# Patient Record
Sex: Female | Born: 1935 | Race: White | Hispanic: No | Marital: Single | Smoking: Current every day smoker
Health system: Southern US, Community
[De-identification: ages and names within clinical notes are randomized; demographics above are authoritative.]

## PROBLEM LIST (undated history)

## (undated) DIAGNOSIS — I499 Cardiac arrhythmia, unspecified: Secondary | ICD-10-CM

## (undated) DIAGNOSIS — M109 Gout, unspecified: Secondary | ICD-10-CM

## (undated) DIAGNOSIS — M069 Rheumatoid arthritis, unspecified: Secondary | ICD-10-CM

## (undated) DIAGNOSIS — Z993 Dependence on wheelchair: Secondary | ICD-10-CM

## (undated) DIAGNOSIS — N183 Chronic kidney disease, stage 3 unspecified: Secondary | ICD-10-CM

## (undated) DIAGNOSIS — I482 Chronic atrial fibrillation, unspecified: Secondary | ICD-10-CM

## (undated) DIAGNOSIS — E119 Type 2 diabetes mellitus without complications: Secondary | ICD-10-CM

## (undated) DIAGNOSIS — I10 Essential (primary) hypertension: Secondary | ICD-10-CM

## (undated) DIAGNOSIS — H409 Unspecified glaucoma: Secondary | ICD-10-CM

## (undated) DIAGNOSIS — M1990 Unspecified osteoarthritis, unspecified site: Secondary | ICD-10-CM

## (undated) HISTORY — PX: OTHER SURGICAL HISTORY: SHX169

## (undated) HISTORY — PX: ABDOMINAL HYSTERECTOMY: SHX81

## (undated) HISTORY — PX: CATARACT EXTRACTION: SUR2

## (undated) HISTORY — PX: APPENDECTOMY: SHX54

---

## 2013-06-04 ENCOUNTER — Emergency Department (HOSPITAL_BASED_OUTPATIENT_CLINIC_OR_DEPARTMENT_OTHER): Payer: Medicare Other

## 2013-06-04 ENCOUNTER — Emergency Department (HOSPITAL_BASED_OUTPATIENT_CLINIC_OR_DEPARTMENT_OTHER)
Admission: EM | Admit: 2013-06-04 | Discharge: 2013-06-04 | Disposition: A | Discharge status: Home / Self Care | Payer: Medicare Other | Attending: Emergency Medicine | Admitting: Emergency Medicine

## 2013-06-04 ENCOUNTER — Encounter (HOSPITAL_BASED_OUTPATIENT_CLINIC_OR_DEPARTMENT_OTHER): Payer: Self-pay | Admitting: Emergency Medicine

## 2013-06-04 DIAGNOSIS — R630 Anorexia: Secondary | ICD-10-CM | POA: Insufficient documentation

## 2013-06-04 DIAGNOSIS — Z794 Long term (current) use of insulin: Secondary | ICD-10-CM | POA: Insufficient documentation

## 2013-06-04 DIAGNOSIS — Z8739 Personal history of other diseases of the musculoskeletal system and connective tissue: Secondary | ICD-10-CM | POA: Insufficient documentation

## 2013-06-04 DIAGNOSIS — I10 Essential (primary) hypertension: Secondary | ICD-10-CM | POA: Insufficient documentation

## 2013-06-04 DIAGNOSIS — R531 Weakness: Secondary | ICD-10-CM

## 2013-06-04 DIAGNOSIS — E119 Type 2 diabetes mellitus without complications: Secondary | ICD-10-CM | POA: Insufficient documentation

## 2013-06-04 DIAGNOSIS — Z79899 Other long term (current) drug therapy: Secondary | ICD-10-CM | POA: Insufficient documentation

## 2013-06-04 DIAGNOSIS — R5381 Other malaise: Secondary | ICD-10-CM | POA: Insufficient documentation

## 2013-06-04 DIAGNOSIS — Z9849 Cataract extraction status, unspecified eye: Secondary | ICD-10-CM | POA: Insufficient documentation

## 2013-06-04 DIAGNOSIS — Z8669 Personal history of other diseases of the nervous system and sense organs: Secondary | ICD-10-CM | POA: Insufficient documentation

## 2013-06-04 DIAGNOSIS — M109 Gout, unspecified: Secondary | ICD-10-CM | POA: Insufficient documentation

## 2013-06-04 HISTORY — DX: Type 2 diabetes mellitus without complications: E11.9

## 2013-06-04 HISTORY — DX: Unspecified osteoarthritis, unspecified site: M19.90

## 2013-06-04 HISTORY — DX: Cardiac arrhythmia, unspecified: I49.9

## 2013-06-04 HISTORY — DX: Rheumatoid arthritis, unspecified: M06.9

## 2013-06-04 HISTORY — DX: Gout, unspecified: M10.9

## 2013-06-04 HISTORY — DX: Essential (primary) hypertension: I10

## 2013-06-04 HISTORY — DX: Unspecified glaucoma: H40.9

## 2013-06-04 LAB — COMPREHENSIVE METABOLIC PANEL
ALT: 15 U/L (ref 0–35)
BUN: 55 mg/dL — ABNORMAL HIGH (ref 6–23)
CO2: 30 mEq/L (ref 19–32)
Calcium: 13 mg/dL — ABNORMAL HIGH (ref 8.4–10.5)
Chloride: 91 mEq/L — ABNORMAL LOW (ref 96–112)
Creatinine, Ser: 1.6 mg/dL — ABNORMAL HIGH (ref 0.50–1.10)
GFR calc Af Amer: 33 mL/min — ABNORMAL LOW (ref >90)
GFR calc non Af Amer: 29 mL/min — ABNORMAL LOW (ref >90)
Glucose, Bld: 146 mg/dL — ABNORMAL HIGH (ref 70–99)
Potassium: 3.2 mEq/L — ABNORMAL LOW (ref 3.5–5.1)
Sodium: 134 mEq/L — ABNORMAL LOW (ref 135–145)

## 2013-06-04 LAB — URINALYSIS, ROUTINE W REFLEX MICROSCOPIC
Bilirubin Urine: NEGATIVE
Hgb urine dipstick: NEGATIVE
Nitrite: NEGATIVE
Protein, ur: NEGATIVE mg/dL
Specific Gravity, Urine: 1.011 (ref 1.005–1.030)
Urobilinogen, UA: 0.2 mg/dL (ref 0.0–1.0)
pH: 5.5 (ref 5.0–8.0)

## 2013-06-04 LAB — URINE MICROSCOPIC-ADD ON

## 2013-06-04 LAB — CBC WITH DIFFERENTIAL/PLATELET
Basophils Absolute: 0.1 10*3/uL (ref 0.0–0.1)
Basophils Relative: 1 % (ref 0–1)
Eosinophils Absolute: 0.1 10*3/uL (ref 0.0–0.7)
HCT: 38.2 % (ref 36.0–46.0)
Hemoglobin: 12.7 g/dL (ref 12.0–15.0)
MCH: 30.8 pg (ref 26.0–34.0)
MCHC: 33.2 g/dL (ref 30.0–36.0)
MCV: 92.7 fL (ref 78.0–100.0)
Monocytes Absolute: 1.2 10*3/uL — ABNORMAL HIGH (ref 0.1–1.0)
Neutro Abs: 9 10*3/uL — ABNORMAL HIGH (ref 1.7–7.7)
Neutrophils Relative %: 76 % (ref 43–77)
Platelets: 300 10*3/uL (ref 150–400)
RBC: 4.12 MIL/uL (ref 3.87–5.11)
RDW: 14.1 % (ref 11.5–15.5)

## 2013-06-04 MED ORDER — HYDROCODONE-ACETAMINOPHEN 5-325 MG PO TABS
1.0000 | ORAL_TABLET | Freq: Once | ORAL | Status: AC
  Administered 2013-06-04: 1 via ORAL
  Filled 2013-06-04: qty 1

## 2013-06-04 MED ORDER — SODIUM CHLORIDE 0.9 % IV BOLUS (SEPSIS)
500.0000 mL | Freq: Once | INTRAVENOUS | Status: AC
  Administered 2013-06-04: 500 mL via INTRAVENOUS

## 2013-06-04 MED ORDER — HYDROCODONE-ACETAMINOPHEN 5-325 MG PO TABS: 1.0000 | ORAL_TABLET | Freq: Four times a day (QID) | ORAL | Status: DC | PRN

## 2013-06-04 MED ORDER — ACETAMINOPHEN 325 MG PO TABS
650.0000 mg | ORAL_TABLET | Freq: Once | ORAL | Status: AC
  Administered 2013-06-04: 650 mg via ORAL
  Filled 2013-06-04: qty 2

## 2013-06-04 MED ORDER — POTASSIUM CHLORIDE CRYS ER 20 MEQ PO TBCR
30.0000 meq | EXTENDED_RELEASE_TABLET | Freq: Once | ORAL | Status: AC
  Administered 2013-06-04: 30 meq via ORAL

## 2013-06-04 MED ORDER — POTASSIUM CHLORIDE CRYS ER 20 MEQ PO TBCR
EXTENDED_RELEASE_TABLET | ORAL | Status: AC
  Filled 2013-06-04: qty 2

## 2013-06-04 MED ORDER — ACETAMINOPHEN 325 MG PO TABS
ORAL_TABLET | ORAL | Status: AC
  Administered 2013-06-04: 650 mg via ORAL
  Filled 2013-06-04: qty 2

## 2013-06-04 NOTE — ED Notes (Signed)
Multiple complaints: progressive weakness in past 3 months, loss of appetite, decreased urine output, urinary frequency, left joint pain, right elbow, right shoulder pain. Unable to do her ADLs. Pt is alert, oriented x3. HR 79, irregular.

## 2013-06-04 NOTE — ED Provider Notes (Signed)
CSN: [id number]     Arrival date & time 06/04/13  4098 History  This chart was scribed for Candyce Churn, MD by Ronal Fear, ED Scribe. This patient was seen in room MH03/MH03 and the patient's care was started at 8:05 PM.    Chief Complaint  Patient presents with  . Weakness    Patient is a 79 y.o. female presenting with weakness. The history is provided by the patient and a relative. No language interpreter was used.  Weakness The current episode started more than 1 week ago. The problem occurs constantly. The problem has been gradually worsening. Pertinent negatives include no abdominal pain and no shortness of breath. The symptoms are aggravated by exertion. Nothing relieves the symptoms.   HPI Comments: Rhonda Dudley is a 79 y.o. female who presents to the Emergency Department with her daughter complaining of gradually worsening weakness and decreased appetite. In march pt could not get out of bed for an unclear reason. Pt stopped walking 3 months ago with severe pain. She has been taking gout pills with no relief. Pt has not walked since onset, she has been using her upper body to get around. Pt currently living with her daughter's family. Pt hit her knee after physical therapy 1 month ago and has grown worse. Pt has lost her appetite onset 1x weeks.  Daughter reports that pt has been progressively weaker over course of week.  Pt's weakness has gradually worsened and her appetite has decreased.  Pt was taking Hx of rheumatoid arthritis, a-fib, DM, glaucoma,gout, and DJD. Pt also complains of bilateral knee pain. PCP: Dr. Albertina Parr  Past Medical History  Diagnosis Date  . Rheumatoid arthritis   . DJD (degenerative joint disease)   . Diabetes mellitus without complication   . Hypertension   . Irregular heart beat   . Glaucoma   . Gout    Past Surgical History  Procedure Laterality Date  . Cataract extraction    . Abdominal hysterectomy    . Carpel tunnel    . Appendectomy      No family history on file. History  Substance Use Topics  . Smoking status: Never Smoker   . Smokeless tobacco: Not on file  . Alcohol Use: Yes   OB History   Grav Para Term Preterm Abortions TAB SAB Ect Mult Living                 Review of Systems  Constitutional: Positive for activity change and appetite change. Negative for fever.  Respiratory: Negative for shortness of breath.   Gastrointestinal: Negative for abdominal pain.  Musculoskeletal: Positive for arthralgias and joint swelling.  Neurological: Positive for weakness. Negative for syncope, speech difficulty and numbness.  All other systems reviewed and are negative.    Allergies  Levaquin and Sulfa antibiotics  Home Medications   Current Outpatient Rx  Name  Route  Sig  Dispense  Refill  . allopurinol (ZYLOPRIM) 100 MG tablet   Oral   Take 100 mg by mouth daily.         . colchicine 0.6 MG tablet   Oral   Take 0.6 mg by mouth daily.         . furosemide (LASIX) 40 MG tablet   Oral   Take 40 mg by mouth.         Marland Kitchen glipiZIDE (GLUCOTROL XL) 10 MG 24 hr tablet   Oral   Take 10 mg by mouth daily.         Marland Kitchen  insulin glargine (LANTUS) 100 UNIT/ML injection   Subcutaneous   Inject 100 Units into the skin at bedtime.         . metoprolol succinate (TOPROL-XL) 50 MG 24 hr tablet   Oral   Take 50 mg by mouth daily. Take with or immediately following a meal.         . Olmesartan-Amlodipine-HCTZ (TRIBENZOR) 40-5-25 MG TABS   Oral   Take by mouth.         . potassium chloride (K-DUR,KLOR-CON) 10 MEQ tablet   Oral   Take 10 mEq by mouth 2 (two) times daily.         . pramipexole (MIRAPEX) 0.125 MG tablet   Oral   Take 0.125 mg by mouth 3 (three) times daily.          BP 120/62  Pulse 85  Temp(Src) 98.7 F (37.1 C) (Oral)  Resp 20  Ht 5\' 8"  (1.727 m)  Wt 200 lb (90.719 kg)  BMI 30.42 kg/m2  SpO2 98% Physical Exam  Nursing note and vitals reviewed. Constitutional: She is  oriented to person, place, and time. She appears well-developed and well-nourished. No distress.  HENT:  Head: Normocephalic and atraumatic.  Mouth/Throat: Oropharynx is clear and moist.  Eyes: Conjunctivae are normal. Pupils are equal, round, and reactive to light. No scleral icterus.  Neck: Neck supple.  Cardiovascular: Normal rate, regular rhythm, normal heart sounds and intact distal pulses.   No murmur heard. Pulmonary/Chest: Effort normal and breath sounds normal. No stridor. No respiratory distress. She has no rales.  Abdominal: Soft. Bowel sounds are normal. She exhibits no distension. There is no tenderness.  Musculoskeletal: Normal range of motion.  Neurological: She is alert and oriented to person, place, and time.  Generalized weakness. No clonus. 2/5 strength in left hip flexion. 3/5 in right hip flexion. 5/5 strength in bilateral ankle dorsi and plantar flexion. 5/5 BUE strength  Skin: Skin is warm and dry. No rash noted.  Psychiatric: She has a normal mood and affect. Her behavior is normal.    ED Course  Procedures (including critical care time) DIAGNOSTIC STUDIES: Oxygen Saturation is 98% on RA, normal by my interpretation.    COORDINATION OF CARE:    8:18 PM- Pt advised of plan for treatment including  X-ray, and CT of her head and pt agrees.  Labs Review Labs Reviewed  URINALYSIS, ROUTINE W REFLEX MICROSCOPIC - Abnormal; Notable for the following:    Leukocytes, UA TRACE (*)    All other components within normal limits  URINE MICROSCOPIC-ADD ON   Imaging Review No results found.  EKG Interpretation     Ventricular Rate:  84 PR Interval:    QRS Duration: 86 QT Interval:  346 QTC Calculation: 408 R Axis:   -30 Text Interpretation:  Atrial fibrillation Atrial flutter Left axis deviation Low voltage QRS Nonspecific T wave abnormality Abnormal ECG No old tracing to compare            MDM   1. Weakness    79 yo female with generalized weakness  which has been worsening for last 6 months.  Daughter thinks it is worse than usual over past week.  She also reports decreased appetite.  Pt is well appearing, nontoxic, and with stable vitals.  Exam notable for generalized weakness, with worse weakness with hip flexion (although this seems somewhat effort dependent and she states the weakness is actually secondary to pain in her knees).  Both knees are well  appearing without signs of infection.  Her lab tests show minimal leukocytosis and renal dysfunction.  It appears that her renal dysfunction is chronic (her daughter reports that her PCP has referred her to a nephrologist, but she has not yet gone), although we have no lab values with which to compare.  EKG shows a fib, which her daughter states she has a history of.  Her head CT and CXR show no significant acute findings.  Her daughter felt that her symptoms might be exacerbated by depression, which pt seems to agree with.  I do not think inpatient hospitalization would be helpful for this patient.  I advised close PCP follow up and gave return precautions.  She requested pain meds for her knees.  Her doctor previously prescribed norco (although pt still has the unfilled prescription with her dated 7 months ago).  Will provide short course of this med with strict instructions for her daughter to monitor its use and side effects.  Given PO potassium for mild hypokalemia.    I personally performed the services described in this documentation, which was scribed in my presence. The recorded information has been reviewed and is accurate.     Candyce Churn, MD 06/04/13 [phone]

## 2013-06-04 NOTE — ED Notes (Signed)
Daughter reports pt has had weakness for about 3 months and unable to walk.  Has had decreased urinary output, denies dysuria.  Decreased po intake for 2 days.  Hx of chronic pain d/t arthritis ? Decreased mobility d/t pain vs infection per daughter.

## 2014-01-18 ENCOUNTER — Other Ambulatory Visit: Payer: Self-pay | Admitting: Nephrology

## 2014-01-21 ENCOUNTER — Other Ambulatory Visit: Payer: Medicare Other

## 2014-02-01 ENCOUNTER — Ambulatory Visit
Admission: RE | Admit: 2014-02-01 | Discharge: 2014-02-01 | Disposition: A | Discharge status: Home / Self Care | Payer: Medicare Other | Source: Ambulatory Visit | Attending: Nephrology | Admitting: Nephrology

## 2015-05-21 ENCOUNTER — Encounter (HOSPITAL_BASED_OUTPATIENT_CLINIC_OR_DEPARTMENT_OTHER): Payer: Self-pay | Admitting: Emergency Medicine

## 2015-05-21 ENCOUNTER — Emergency Department (HOSPITAL_BASED_OUTPATIENT_CLINIC_OR_DEPARTMENT_OTHER): Payer: Medicare Other

## 2015-05-21 ENCOUNTER — Emergency Department (HOSPITAL_BASED_OUTPATIENT_CLINIC_OR_DEPARTMENT_OTHER)
Admission: EM | Admit: 2015-05-21 | Discharge: 2015-05-21 | Disposition: A | Discharge status: Home / Self Care | Payer: Medicare Other | Attending: Emergency Medicine | Admitting: Emergency Medicine

## 2015-05-21 DIAGNOSIS — M109 Gout, unspecified: Secondary | ICD-10-CM | POA: Diagnosis not present

## 2015-05-21 DIAGNOSIS — B029 Zoster without complications: Secondary | ICD-10-CM | POA: Diagnosis not present

## 2015-05-21 DIAGNOSIS — Z8669 Personal history of other diseases of the nervous system and sense organs: Secondary | ICD-10-CM | POA: Insufficient documentation

## 2015-05-21 DIAGNOSIS — Z794 Long term (current) use of insulin: Secondary | ICD-10-CM | POA: Insufficient documentation

## 2015-05-21 DIAGNOSIS — I10 Essential (primary) hypertension: Secondary | ICD-10-CM | POA: Insufficient documentation

## 2015-05-21 DIAGNOSIS — Z79899 Other long term (current) drug therapy: Secondary | ICD-10-CM | POA: Diagnosis not present

## 2015-05-21 DIAGNOSIS — R21 Rash and other nonspecific skin eruption: Secondary | ICD-10-CM | POA: Diagnosis present

## 2015-05-21 DIAGNOSIS — E119 Type 2 diabetes mellitus without complications: Secondary | ICD-10-CM | POA: Diagnosis not present

## 2015-05-21 LAB — URINALYSIS, ROUTINE W REFLEX MICROSCOPIC
Bilirubin Urine: NEGATIVE
Glucose, UA: 100 mg/dL — AB
Hgb urine dipstick: NEGATIVE
Ketones, ur: NEGATIVE mg/dL
Nitrite: NEGATIVE
Protein, ur: 30 mg/dL — AB
Specific Gravity, Urine: 1.012 (ref 1.005–1.030)
Urobilinogen, UA: 0.2 mg/dL (ref 0.0–1.0)
pH: 7 (ref 5.0–8.0)

## 2015-05-21 LAB — CBC WITH DIFFERENTIAL/PLATELET
Basophils Absolute: 0.1 10*3/uL (ref 0.0–0.1)
Basophils Relative: 1 %
Eosinophils Absolute: 0.1 10*3/uL (ref 0.0–0.7)
Eosinophils Relative: 1 %
HCT: 40.7 % (ref 36.0–46.0)
Hemoglobin: 13.3 g/dL (ref 12.0–15.0)
Lymphocytes Relative: 23 %
Lymphs Abs: 1.1 10*3/uL (ref 0.7–4.0)
MCH: 31.3 pg (ref 26.0–34.0)
MCHC: 32.7 g/dL (ref 30.0–36.0)
MCV: 95.8 fL (ref 78.0–100.0)
Monocytes Absolute: 0.8 10*3/uL (ref 0.1–1.0)
Monocytes Relative: 17 %
Neutro Abs: 2.8 10*3/uL (ref 1.7–7.7)
Neutrophils Relative %: 58 %
Platelets: ADEQUATE 10*3/uL (ref 150–400)
RBC: 4.25 MIL/uL (ref 3.87–5.11)
RDW: 12.5 % (ref 11.5–15.5)
Smear Review: ADEQUATE
WBC: 4.9 10*3/uL (ref 4.0–10.5)

## 2015-05-21 LAB — COMPREHENSIVE METABOLIC PANEL
ALT: 15 U/L (ref 14–54)
AST: 19 U/L (ref 15–41)
Albumin: 3.6 g/dL (ref 3.5–5.0)
Alkaline Phosphatase: 76 U/L (ref 38–126)
Anion gap: 7 (ref 5–15)
BUN: 30 mg/dL — ABNORMAL HIGH (ref 6–20)
CO2: 28 mmol/L (ref 22–32)
Calcium: 11.3 mg/dL — ABNORMAL HIGH (ref 8.9–10.3)
Chloride: 101 mmol/L (ref 101–111)
Creatinine, Ser: 1.63 mg/dL — ABNORMAL HIGH (ref 0.44–1.00)
GFR calc Af Amer: 32 mL/min — ABNORMAL LOW (ref >60)
GFR calc non Af Amer: 28 mL/min — ABNORMAL LOW (ref >60)
Glucose, Bld: 132 mg/dL — ABNORMAL HIGH (ref 65–99)
Potassium: 3.5 mmol/L (ref 3.5–5.1)
Sodium: 136 mmol/L (ref 135–145)
Total Bilirubin: 0.5 mg/dL (ref 0.3–1.2)
Total Protein: 6.4 g/dL — ABNORMAL LOW (ref 6.5–8.1)

## 2015-05-21 LAB — URINE MICROSCOPIC-ADD ON

## 2015-05-21 LAB — TROPONIN I: Troponin I: <0.03 ng/mL (ref <0.031)

## 2015-05-21 MED ORDER — HYDROCODONE-ACETAMINOPHEN 5-325 MG PO TABS
1.0000 | ORAL_TABLET | Freq: Once | ORAL | Status: AC
  Administered 2015-05-21: 1 via ORAL
  Filled 2015-05-21: qty 1

## 2015-05-21 MED ORDER — VALACYCLOVIR HCL 1 G PO TABS: 1000.0000 mg | ORAL_TABLET | Freq: Three times a day (TID) | ORAL | Status: AC

## 2015-05-21 MED ORDER — VALACYCLOVIR HCL 500 MG PO TABS
500.0000 mg | ORAL_TABLET | Freq: Once | ORAL | Status: AC
  Administered 2015-05-21: 500 mg via ORAL
  Filled 2015-05-21: qty 1

## 2015-05-21 MED ORDER — HYDROCODONE-ACETAMINOPHEN 5-325 MG PO TABS: 1.0000 | ORAL_TABLET | ORAL | Status: DC | PRN

## 2015-05-21 NOTE — Discharge Instructions (Signed)

## 2015-05-21 NOTE — ED Notes (Signed)
Patient transported to X-ray 

## 2015-05-21 NOTE — ED Provider Notes (Signed)
CSN: [id number]     Arrival date & time 05/21/15  1920 History  By signing my name below, I, Rhonda Dudley, attest that this documentation has been prepared under the direction and in the presence of Rhonda Octave, MD. Electronically Signed: Budd Dudley, ED Scribe. 05/21/2015. 7:58 PM.     Chief Complaint  Patient presents with  . Rash   The history is provided by the patient and a relative. No language interpreter was used.   HPI Comments: Rhonda Dudley is a 81 y.o. female with a PMHx of DM, HTN, and irregular heartbeat who presents to the Emergency Department complaining of an itchy, painful, weeping, purple-ish rash to the bilateral axilla, upper back, and chest onset 5 days ago. Per daughter, pt has been complaining of itching to the armpits for the past few days and finally had her look at it. She states she has been applying neosporin to the pt's affected areas. Per daughter, pt has associated nausea, vomiting (1x today). Pt stays with her daughter and is not ambulatory. Pt had a BM this morning. She states pt is not on pain medication, blood thinners, or any medication for her DM. She states the pt's PCP is Rhonda Lor, PA-C. Pt denies SOB, fever, abdominal pain, dizziness, HA, loss of appetite, and CP.  Past Medical History  Diagnosis Date  . Rheumatoid arthritis (HCC)   . DJD (degenerative joint disease)   . Diabetes mellitus without complication (HCC)   . Hypertension   . Irregular heart beat   . Glaucoma   . Gout    Past Surgical History  Procedure Laterality Date  . Cataract extraction    . Abdominal hysterectomy    . Carpel tunnel    . Appendectomy     History reviewed. No pertinent family history. Social History  Substance Use Topics  . Smoking status: Never Smoker   . Smokeless tobacco: None  . Alcohol Use: Yes   OB History    No data available     Review of Systems A complete 10 system review of systems was obtained and all systems are negative except  as noted in the HPI and PMH.   Allergies  Levaquin and Sulfa antibiotics  Home Medications   Prior to Admission medications   Medication Sig Start Date End Date Taking? Authorizing Provider  allopurinol (ZYLOPRIM) 100 MG tablet Take 100 mg by mouth daily.   Yes Historical Provider, MD  amLODipine (NORVASC) 5 MG tablet Take 5 mg by mouth daily.   Yes Historical Provider, MD  bisacodyl (DULCOLAX) 10 MG suppository Place 10 mg rectally as needed for moderate constipation.   Yes Historical Provider, MD  colchicine 0.6 MG tablet Take 0.6 mg by mouth daily.   Yes Historical Provider, MD  magnesium hydroxide (MILK OF MAGNESIA) 400 MG/5ML suspension Take 30 mLs by mouth every other day.   Yes Historical Provider, MD  metoprolol succinate (TOPROL-XL) 50 MG 24 hr tablet Take 50 mg by mouth daily. Take with or immediately following a meal.   Yes Historical Provider, MD  potassium chloride (K-DUR,KLOR-CON) 10 MEQ tablet Take 10 mEq by mouth 2 (two) times daily.   Yes Historical Provider, MD  furosemide (LASIX) 40 MG tablet Take 40 mg by mouth.    Historical Provider, MD  glipiZIDE (GLUCOTROL XL) 10 MG 24 hr tablet Take 10 mg by mouth daily.    Historical Provider, MD  HYDROcodone-acetaminophen (NORCO/VICODIN) 5-325 MG tablet Take 1 tablet by mouth every 4 (four)  hours as needed. 05/21/15   Rhonda Octave, MD  insulin glargine (LANTUS) 100 UNIT/ML injection Inject 100 Units into the skin at bedtime.    Historical Provider, MD  Olmesartan-Amlodipine-HCTZ (TRIBENZOR) 40-5-25 MG TABS Take by mouth.    Historical Provider, MD  pramipexole (MIRAPEX) 0.125 MG tablet Take 0.125 mg by mouth 3 (three) times daily.    Historical Provider, MD  valACYclovir (VALTREX) 1000 MG tablet Take 1 tablet (1,000 mg total) by mouth 3 (three) times daily. 05/21/15 05/31/15  Rhonda Octave, MD   BP 110/58 mmHg  Pulse 73  Temp(Src) 98.7 F (37.1 C) (Oral)  Resp 16  Ht 5\' 8"  (1.727 m)  Wt 120 lb (54.432 kg)  BMI 18.25  kg/m2  SpO2 99% Physical Exam  Constitutional: She is oriented to person, place, and time. She appears well-developed and well-nourished. No distress.  HENT:  Head: Normocephalic and atraumatic.  Mouth/Throat: Oropharynx is clear and moist. No oropharyngeal exudate.  Eyes: Conjunctivae and EOM are normal. Pupils are equal, round, and reactive to light.  Neck: Normal range of motion. Neck supple.  No meningismus.  Cardiovascular: Normal rate, normal heart sounds and intact distal pulses.   No murmur heard. Irregular rhythm  Pulmonary/Chest: Effort normal and breath sounds normal. No respiratory distress.  Abdominal: Soft. There is no tenderness. There is no rebound and no guarding.  Musculoskeletal: Normal range of motion. She exhibits no edema or tenderness.  Neurological: She is alert and oriented to person, place, and time. No cranial nerve deficit. She exhibits normal muscle tone. Coordination normal.  Moving all extremities, cranial nerves 2-12 intact  Skin: Skin is warm. Rash noted. There is erythema.  Extensive rash to L chest, breast, axilla, and upper back. Erythematous with vesicles as depicted.   Psychiatric: She has a normal mood and affect. Her behavior is normal.  Nursing note and vitals reviewed.       ED Course  Procedures  DIAGNOSTIC STUDIES: Oxygen Saturation is 100% on RA, normal by my interpretation.    COORDINATION OF CARE: 7:54 PM - Discussed probable shingles. Discussed plans to order diagnostic studies, as well as pain and anti-viral medication. Pt advised of plan for treatment and pt agrees.  Labs Review Labs Reviewed  COMPREHENSIVE METABOLIC PANEL - Abnormal; Notable for the following:    Glucose, Bld 132 (*)    BUN 30 (*)    Creatinine, Ser 1.63 (*)    Calcium 11.3 (*)    Total Protein 6.4 (*)    GFR calc non Af Amer 28 (*)    GFR calc Af Amer 32 (*)    All other components within normal limits  URINALYSIS, ROUTINE W REFLEX MICROSCOPIC (NOT AT  [facility]) - Abnormal; Notable for the following:    APPearance CLOUDY (*)    Glucose, UA 100 (*)    Protein, ur 30 (*)    Leukocytes, UA TRACE (*)    All other components within normal limits  URINE MICROSCOPIC-ADD ON - Abnormal; Notable for the following:    Bacteria, UA FEW (*)    All other components within normal limits  CBC WITH DIFFERENTIAL/PLATELET  TROPONIN I    Imaging Review Dg Chest 2 View  05/21/2015  CLINICAL DATA:  Chest pain.  Irregular heartbeat. EXAM: CHEST  2 VIEW COMPARISON:  Chest CT 02/01/2014 FINDINGS: Mild cardiomegaly, unchanged from prior exam. There is atherosclerosis and tortuosity of the thoracic aorta. Minimal vascular congestion, also unchanged allowing for differences in modality. No overt pulmonary edema.  No confluent airspace disease, large pleural effusion or pneumothorax. No acute osseous abnormalities are seen. IMPRESSION: Mild cardiomegaly and minimal vascular congestion, similar to prior exam allowing for differences in modality. No acute pulmonary process. Electronically Signed   By: Rubye Oaks M.D.   On: 05/21/2015 20:55   I have personally reviewed and evaluated these images and lab results as part of my medical decision-making.   EKG Interpretation   Date/Time:  Sunday May 21 2015 20:15:53 EDT Ventricular Rate:  68 PR Interval:    QRS Duration: 70 QT Interval:  360 QTC Calculation: 382 R Axis:   -54 Text Interpretation:  Atrial fibrillation Left axis deviation Low voltage  QRS Inferior infarct , age undetermined Cannot rule out Anterior infarct ,  age undetermined Abnormal ECG No significant change was found Confirmed by  Manus Gunning  MD, James Lafalce [phone]) on 05/21/2015 8:23:33 PM      MDM   Final diagnoses:  Herpes zoster   Painful rash to left chest, breast and  axilla. No fever. One episode of vomiting prior to arrival tonight. No chest pain or shortness of breath. No abdominal pain.  Severe case of shingles. No evidence of  super infection.  Given patient's age and episode of vomiting, additional labs will be obtained. Labs show stable creatinine. Otherwise unremarkable. Urinalysis negative. Chest x-ray negative.  Treat for shingles with pain control and antivirals.  Follow up with PCP return precautions discussed. Advised to avoid pregnant women and small children.   I personally performed the services described in this documentation, which was scribed in my presence. The recorded information has been reviewed and is accurate.   Rhonda Octave, MD 05/22/15 [phone]

## 2015-05-21 NOTE — ED Notes (Signed)
Patient has a new onset rash to her under breast area to her back

## 2015-05-21 NOTE — ED Notes (Signed)
Rancour MD at bedside 

## 2015-06-20 ENCOUNTER — Emergency Department (HOSPITAL_BASED_OUTPATIENT_CLINIC_OR_DEPARTMENT_OTHER)
Admission: EM | Admit: 2015-06-20 | Discharge: 2015-06-20 | Discharge status: Against Medical Advice | Payer: Medicare Other | Attending: Emergency Medicine | Admitting: Emergency Medicine

## 2015-06-20 ENCOUNTER — Encounter (HOSPITAL_BASED_OUTPATIENT_CLINIC_OR_DEPARTMENT_OTHER): Payer: Self-pay

## 2015-06-20 DIAGNOSIS — L0231 Cutaneous abscess of buttock: Secondary | ICD-10-CM | POA: Diagnosis present

## 2015-06-20 DIAGNOSIS — F17200 Nicotine dependence, unspecified, uncomplicated: Secondary | ICD-10-CM | POA: Insufficient documentation

## 2015-06-20 DIAGNOSIS — M79602 Pain in left arm: Secondary | ICD-10-CM | POA: Diagnosis not present

## 2015-06-20 DIAGNOSIS — E119 Type 2 diabetes mellitus without complications: Secondary | ICD-10-CM | POA: Insufficient documentation

## 2015-06-20 DIAGNOSIS — I10 Essential (primary) hypertension: Secondary | ICD-10-CM | POA: Insufficient documentation

## 2015-06-20 NOTE — ED Notes (Signed)
During triage, pt's daughter on phone with family member that works in another ED to check wait time there after being notified we have a wait-states she and pt may leave and go home and f/u tomorrow

## 2015-06-20 NOTE — ED Notes (Signed)
Daughter of patient told registration that they no longer wanted to wait to be treated. The daughter took the patient off property.

## 2015-06-20 NOTE — ED Notes (Signed)
C/o abscess to buttock x 3 days-pain to left arm since being treated for shingles x 3 weeks

## 2015-06-21 ENCOUNTER — Emergency Department (HOSPITAL_COMMUNITY)
Admission: EM | Admit: 2015-06-21 | Discharge: 2015-06-21 | Disposition: A | Discharge status: Home / Self Care | Payer: Medicare Other | Attending: Emergency Medicine | Admitting: Emergency Medicine

## 2015-06-21 ENCOUNTER — Encounter (HOSPITAL_COMMUNITY): Payer: Self-pay

## 2015-06-21 DIAGNOSIS — Z79899 Other long term (current) drug therapy: Secondary | ICD-10-CM | POA: Insufficient documentation

## 2015-06-21 DIAGNOSIS — M069 Rheumatoid arthritis, unspecified: Secondary | ICD-10-CM | POA: Diagnosis not present

## 2015-06-21 DIAGNOSIS — R011 Cardiac murmur, unspecified: Secondary | ICD-10-CM | POA: Insufficient documentation

## 2015-06-21 DIAGNOSIS — G8929 Other chronic pain: Secondary | ICD-10-CM

## 2015-06-21 DIAGNOSIS — M79602 Pain in left arm: Secondary | ICD-10-CM | POA: Insufficient documentation

## 2015-06-21 DIAGNOSIS — S31829A Unspecified open wound of left buttock, initial encounter: Secondary | ICD-10-CM

## 2015-06-21 DIAGNOSIS — I10 Essential (primary) hypertension: Secondary | ICD-10-CM | POA: Insufficient documentation

## 2015-06-21 DIAGNOSIS — E119 Type 2 diabetes mellitus without complications: Secondary | ICD-10-CM | POA: Insufficient documentation

## 2015-06-21 DIAGNOSIS — M109 Gout, unspecified: Secondary | ICD-10-CM | POA: Diagnosis not present

## 2015-06-21 DIAGNOSIS — M25512 Pain in left shoulder: Secondary | ICD-10-CM | POA: Diagnosis not present

## 2015-06-21 DIAGNOSIS — L089 Local infection of the skin and subcutaneous tissue, unspecified: Secondary | ICD-10-CM | POA: Insufficient documentation

## 2015-06-21 DIAGNOSIS — F17200 Nicotine dependence, unspecified, uncomplicated: Secondary | ICD-10-CM | POA: Diagnosis not present

## 2015-06-21 DIAGNOSIS — R51 Headache: Secondary | ICD-10-CM | POA: Diagnosis not present

## 2015-06-21 MED ORDER — OXYCODONE-ACETAMINOPHEN 5-325 MG PO TABS
1.0000 | ORAL_TABLET | Freq: Once | ORAL | Status: AC
  Administered 2015-06-21: 1 via ORAL
  Filled 2015-06-21: qty 1

## 2015-06-21 MED ORDER — MUPIROCIN CALCIUM 2 % EX CREA
TOPICAL_CREAM | Freq: Once | CUTANEOUS | Status: AC
  Administered 2015-06-21: 1 via TOPICAL
  Filled 2015-06-21: qty 15

## 2015-06-21 MED ORDER — DOCUSATE SODIUM 100 MG PO CAPS: 100.0000 mg | ORAL_CAPSULE | Freq: Two times a day (BID) | ORAL | Status: AC

## 2015-06-21 MED ORDER — OXYCODONE-ACETAMINOPHEN 5-325 MG PO TABS: 1.0000 | ORAL_TABLET | ORAL | Status: DC | PRN

## 2015-06-21 MED ORDER — MUPIROCIN CALCIUM 2 % EX CREA: 1.0000 "application " | TOPICAL_CREAM | Freq: Two times a day (BID) | CUTANEOUS | Status: AC

## 2015-06-21 NOTE — ED Notes (Signed)
Using clean technique, applied the ordered medication of Mupirocin cream. Covered wound with 4x4 gauze and secured with surgical tape. Educated patients family on how to care for wound. Pt tolerated it well. Float nurse.

## 2015-06-21 NOTE — ED Notes (Signed)
Bed: [facility] Expected date:  Expected time:  Means of arrival:  Comments: 

## 2015-06-21 NOTE — Discharge Instructions (Signed)
Chronic Pain  Chronic pain can be defined as pain that is off and on and lasts for 3-6 months or longer. Many things cause chronic pain, which can make it difficult to make a diagnosis. There are many treatment options available for chronic pain. However, finding a treatment that works well for you may require trying various approaches until the right one is found. Many people benefit from a combination of two or more types of treatment to control their pain.  SYMPTOMS   Chronic pain can occur anywhere in the body and can range from mild to very severe. Some types of chronic pain include:  · Headache.  · Low back pain.  · Cancer pain.  · Arthritis pain.  · Neurogenic pain. This is pain resulting from damage to nerves.   People with chronic pain may also have other symptoms such as:  · Depression.  · Anger.  · Insomnia.  · Anxiety.  DIAGNOSIS   Your health care provider will help diagnose your condition over time. In many cases, the initial focus will be on excluding possible conditions that could be causing the pain. Depending on your symptoms, your health care provider may order tests to diagnose your condition. Some of these tests may include:   · Blood tests.    · CT scan.    · MRI.    · X-rays.    · Ultrasounds.    · Nerve conduction studies.    You may need to see a specialist.   TREATMENT   Finding treatment that works well may take time. You may be referred to a pain specialist. He or she may prescribe medicine or therapies, such as:   · Mindful meditation or yoga.  · Shots (injections) of numbing or pain-relieving medicines into the spine or area of pain.  · Local electrical stimulation.  · Acupuncture.    · Massage therapy.    · Aroma, color, light, or sound therapy.    · Biofeedback.    · Working with a physical therapist to keep from getting stiff.    · Regular, gentle exercise.    · Cognitive or behavioral therapy.    · Group support.    Sometimes, surgery may be recommended.   HOME CARE INSTRUCTIONS    · Take all medicines as directed by your health care provider.    · Lessen stress in your life by relaxing and doing things such as listening to calming music.    · Exercise or be active as directed by your health care provider.    · Eat a healthy diet and include things such as vegetables, fruits, fish, and lean meats in your diet.    · Keep all follow-up appointments with your health care provider.    · Attend a support group with others suffering from chronic pain.  SEEK MEDICAL CARE IF:   · Your pain gets worse.    · You develop a new pain that was not there before.    · You cannot tolerate medicines given to you by your health care provider.    · You have new symptoms since your last visit with your health care provider.    SEEK IMMEDIATE MEDICAL CARE IF:   · You feel weak.    · You have decreased sensation or numbness.    · You lose control of bowel or bladder function.    · Your pain suddenly gets much worse.    · You develop shaking.  · You develop chills.  · You develop confusion.  · You develop chest pain.  · You develop shortness of breath.    MAKE SURE YOU:  ·   Document Revised: 03/24/2013 Document Reviewed: 01/15/2013 Elsevier Interactive Patient Education 2016 Elsevier Inc. Pressure Injury A pressure injury, sometimes called a bedsore, is an injury to the skin and underlying tissue caused by pressure. Pressure on blood vessels causes decreased blood flow to the skin, which can eventually cause the skin tissue to die and break down into a wound. Pressure injuries usually occur:  Over bony parts of the body such as the tailbone, shoulders, elbows, hips, and  heels.  Under medical devices such as respiratory equipment, stockings, tubes, and splints. Pressure injuries start as reddened areas on the skin and can lead to pain, muscle damage, and infection. Pressure injuries can vary in severity.  CAUSES Pressure injuries are caused by a lack of blood supply to an area of skin. They can occur from intense pressure over a short period of time or from less intense pressure over a long period of time. RISK FACTORS This condition is more likely to develop in people who:  Are in the hospital or an extended care facility.  Are bedridden or in a wheelchair.  Have an injury or disease that keeps them from:  Moving normally.  Feeling pain or pressure.  Have a condition that:  Makes them sleepy or less alert.  Causes poor blood flow.  Need to wear a medical device.  Have poor control of their bladder or bowel functions (incontinence).  Have poor nutrition (malnutrition).  Are of certain ethnicities. People of African American and Latino or Hispanic descent are at higher risk compared to other ethnic groups. If you are at risk for pressure ulcers, your health care provider may recommend certain types of bedding to help prevent them. These may include foam or gel mattresses covered with one of the following:  A sheepskin blanket.  A pad that is filled with gel, air, water, or foam. SYMPTOMS  The main symptom is a blister or change in skin color that opens into a wound. Other symptoms include:   Red or dark areas of skin that do not turn white or pale when pressed with a finger.   Pain, warmth, or change of skin texture.  DIAGNOSIS This condition is diagnosed with a medical history and physical exam. You may also have tests, including:   Blood tests to check for infection or signs of poor nutrition.  Imaging studies to check for damage to the deep tissues under your skin.  Blood flow studies. Your pressure injury will be staged to  determine its severity. Staging is an assessment of:  The depth of the pressure injury.  Which tissues are exposed because of the pressure injury.  The causes of the pressure injury. TREATMENT The main focus of treatment is to help your injury heal. This may be done by:   Relieving or redistributing pressure on your skin. This includes:  Frequently changing your position.  Eliminating or minimizing positions that caused the wound or that can make the wound worse.  Using specific bed mattresses and chair cushions.  Refitting, resizing, or replacing any medical devices, or padding the skin under them.  Using creams or powders to prevent rubbing (friction) on the skin.  Keeping your skin clean and dry. This may include using a skin cleanser or skin protectant as told by your health care provider. This may be a lotion, ointment, or spray.  Cleaning your injury and removing any dead tissue from the wound (debridement).  Placing a bandage (dressing) over your injury.  Preventing or treating infection. This  may include antibiotic, antimicrobial, or antiseptic medicines. Treatment may also include medicine for pain. Sometimes surgery is needed to close the wound with a flap of healthy skin or a piece of skin from another area of your body (graft). You may need surgery if other treatments are not working or if your injury is very deep. HOME CARE INSTRUCTIONS Wound Care  Follow instructions from your health care provider about:  How to take care of your wound.  When and how you should change your dressing.  When you should remove your dressing. If your dressing is dry and stuck when you try to remove it, moisten or wet the dressing with saline or water so that it can be removed without harming your skin or wound tissue.  Check your wound every day for signs of infection. Have a caregiver do this for you if you are not able. Watch for:  More redness, swelling, or pain.  More  fluid, blood, or pus.  A bad smell. Skin Care  Keep your skin clean and dry. Gently pat your skin dry.  Do not rub or massage your skin.  Use a skin protectant only as told by your health care provider.  Check your skin every day for any changes in color or any new blisters or sores (ulcers). Have a caregiver do this for you if you are not able. Medicines  Take over-the-counter and prescription medicines only as told by your health care provider.  If you were prescribed an antibiotic medicine, take it or apply it as told by your health care provider. Do not stop taking or using the antibiotic even if your condition improves. Reducing and Redistributing Pressure  Do not lie or sit in one position for a long time. Move or change position every two hours or as told by your health care provider.  Use pillows or cushions to reduce pressure. Ask your health care provider to recommend cushions or pads for you.  Use medical devices that do not rub your skin. Tell your health care provider if one of your medical devices is causing a pressure injury to develop. General Instructions  Eat a healthy diet that includes lots of protein. Ask your health care provider for diet advice.  Drink enough fluid to keep your urine clear or pale yellow.  Be as active as you can every day. Ask your health care provider to suggest safe exercises or activities.  Do not abuse drugs or alcohol.  Keep all follow-up visits as told by your health care provider. This is important.  Do not smoke. SEEK MEDICAL CARE IF:  You have chills or fever.  Your pain medicine is not helping.  You have any changes in skin color.  You have new blisters or sores.  You develop warmth, redness, or swelling near a pressure injury.  You have a bad odor or pus coming from your pressure injury.  You lose control of your bowels or bladder.  You develop new symptoms.  Your wound does not improve after 1-2 weeks of  treatment.  You develop a new medical condition, such as diabetes, peripheral vascular disease, or conditions that affect your defense (immune) system.   This information is not intended to replace advice given to you by your health care provider. Make sure you discuss any questions you have with your health care provider.   Document Released: 07/22/2005 Document Revised: 04/12/2015 Document Reviewed: 11/30/2014 Elsevier Interactive Patient Education 2016 [employer].  Emergency Department Resource Guide 1)  Find a Doctor and Pay Out of Pocket Although you won't have to find out who is covered by your insurance plan, it is a good idea to ask around and get recommendations. You will then need to call the office and see if the doctor you have chosen will accept you as a new patient and what types of options they offer for patients who are self-pay. Some doctors offer discounts or will set up payment plans for their patients who do not have insurance, but you will need to ask so you aren't surprised when you get to your appointment.  2) Contact Your Local Health Department Not all health departments have doctors that can see patients for sick visits, but many do, so it is worth a call to see if yours does. If you don't know where your local health department is, you can check in your phone book. The CDC also has a tool to help you locate your state's health department, and many state websites also have listings of all of their local health departments.  3) Find a Walk-in Clinic If your illness is not likely to be very severe or complicated, you may want to try a walk in clinic. These are popping up all over the country in pharmacies, drugstores, and shopping centers. They're usually staffed by nurse practitioners or physician assistants that have been trained to treat common illnesses and complaints. They're usually fairly quick and inexpensive. However, if you have serious medical issues or chronic  medical problems, these are probably not your best option.  No Primary Care Doctor: - Call Health Connect at  [phone] - they can help you locate a primary care doctor that  accepts your insurance, provides certain services, etc. - Physician Referral Service- [phone]  Chronic Pain Problems: Organization         Address  Phone   Notes  [facility]  [phone] Patients need to be referred by their primary care doctor.   Medication Assistance: Organization         Address  Phone   Notes  [facility] Medication [facility] [address] [place], [place] [address] [phone] --Must be a resident of [facility] -- Must have NO insurance coverage whatsoever (no Medicaid/ Medicare, etc.) -- The pt. MUST have a primary care doctor that directs their care regularly and follows them in the community   [facility]  [phone]   [employer]  [phone]    Agencies that provide inexpensive medical care: Organization         Address  Phone   Notes  [facility]  [phone]   [facility]    [phone]   [facility] [address] [place], [place] [address] [phone]   [facility] [address] [place] [place] [phone]   Planned Parenthood    [phone]   [facility]    [phone]   [facility]  [address], [place] Phone:  [phone], Fax:  [phone] Hours of Operation:  9 am - 6 pm, M-F.  Also accepts Medicaid/Medicare and self-pay.  [facility]  [address], [place] Phone: [phone], Fax: [phone]. Hours of Operation:  8:30 am - 5:30 pm, M-F.  Also accepts Medicaid and self-pay.  [facility] [place] [address] [employer] Phone: (  [phone]   [facility] [address] [place], [place] [phone],  Ext. [phone] Mondays & Thursdays: 7-9 AM.  First 15 patients are seen on a first come, first serve basis.    Medicaid-accepting [facility] Providers:  Organization         Address  Phone   Notes  [facility] [address], [place] [phone] Also accepts self-pay patients.  [facility] [place] [address] Laurell Josephs [place], [place]  [phone]   [facility] [address], [place] [phone]   [facility] [address] [place] [phone]   Renaye Rakers [address], [place]   [phone] Only accepts [place] Access [place] patients after they have their name applied to their card.   Self-Pay (no insurance) in [facility]:  Organization         Address  Phone   Notes  Sickle Cell Patients, [facility] [address] [place], [place] [phone]   [facility] [address] [place], [place] [phone]   [facility]  [address], [place] [phone]   [facility]/Dr. Osei-Bonsu  [address], [place] or [address], [place] [phone] Phone number for both [place] and [place] locations is the same.  [facility] [address], [place] [phone]   [facility] [address] [place] or [address] [phone]   [facility] [address] [place] [phone], phone; [phone], fax Sees patients 1st and 3rd Saturday of every month.  Must not qualify for public or private insurance (i.e. Medicaid, Medicare, [place] Health Choice, Veterans' Benefits)  Household income should be no more than 200% of the poverty level The clinic cannot treat you if you are pregnant or think you are pregnant  Sexually transmitted diseases are not  treated at the clinic.    Dental Care: Organization         Address  Phone  Notes  [facility] Department of [facility] [address] [place], [place] [phone] Accepts children up to age 39 who are enrolled in [place] or [place] Health Choice; pregnant women with a Medicaid card; and children who have applied for Medicaid or [place] Health Choice, but were declined, whose parents can pay a reduced fee at time of service.  [facility] Department of [facility]  [address], [place] [phone] Accepts children up to age 46 who are enrolled in [place] or [place] Health Choice; pregnant women with a Medicaid card; and children who have applied for Medicaid or [place] Health Choice, but were declined, whose parents can pay a reduced fee at time of service.  [facility]  [address] [place], [place] [phone] Patients are seen by appointment only. Walk-ins are not accepted. [facility] will see patients 46 years of age and older. Monday - Tuesday (8am-5pm) Most Wednesdays (8:30-5pm) $30 per visit, cash only  [facility]  [address], [facility] [phone] Patients are seen by appointment only. Walk-ins are not accepted. [facility] will see patients 58 years of age and older. One Wednesday Evening (Monthly: Volunteer Based).  $  30 per visit, cash only  [employer] of Dentistry Clinics  [phone] for adults; Children under age 58, call Graduate Pediatric Dentistry at [phone]. Children aged 29-14, please call [phone] to request a pediatric application.  Dental services are provided in all areas of dental care including fillings, crowns and bridges, complete and partial dentures, implants, gum treatment, root canals, and extractions. Preventive care is also provided. Treatment is provided to both adults and children. Patients are selected via a lottery and there is  often a waiting list.   [facility] [address] [place]  [phone] [email or web address]   [facility] [address] [place], [place] [phone], Ext. [phone] Second and Fourth Thursday of each month, opens at 6:30 AM; Clinic ends at 9 AM.  Patients are seen on a first-come first-served basis, and a limited number are seen during each clinic.   [facility]  [address] [place], [place] [phone]   Eligibility Requirements You must have lived in [place], [place], or [place] counties for at least the last three months.   You cannot be eligible for state or federal sponsored [place], including [employer], [place], or [employer].   You generally cannot be eligible for healthcare insurance through your employer.    How to apply: Eligibility screenings are held every Tuesday and Wednesday afternoon from 1:00 pm until 4:00 pm. You do not need an appointment for the interview!  [facility] [address], [place], [place] [phone]   [facility] Health Department  [phone]   [facility] Health Department  [phone]   [facility] Health Department  [phone]    Behavioral Health Resources in the Community: Intensive Outpatient Programs Organization         Address  Phone  Notes  [facility] [address], [place], [place] [phone]   [facility] [address], [place], [place] [phone]   [facility] [address], [place], [place]  [phone]   [facility] Mental Health [address]  [place], [place] [phone] or [phone]   Substance Abuse Resources Organization         Address  Phone  Notes  [facility]  [phone]   [facility]  [phone]   [facility]  [phone]   Floydene Flock  [phone]   Residential & Outpatient Substance  Abuse Program  [phone]   Psychological Services Organization         Address  Phone  Notes  [facility] Health  [phone]   [facility] Services  [phone]   [facility] Mental Health [address] [place] [phone] or [phone]    Mobile Crisis Teams Organization         Address  Phone  Notes  [facility], Mobile Crisis Care Unit  [phone]   [facility]  [address] [place], [place] [phone]   Doristine Locks [address] [place] [phone]    Self-Help/Support Groups Organization         Address  Phone             Notes  Mental Health Assoc. of  - variety of support groups  336- I7437963 Call for more information  Narcotics Anonymous (NA), [facility] [address], [employer] [place]  2 meetings at this location   Statistician  Address  Phone  Notes  [facility] [address]    [place]  [phone]   [facility]  [address], [place] [address], [place], [place] [phone]   [facility] [address] [place], [place] [phone] Admissions: 8am-3pm M-F  [facility] [address],    [place], [place] [phone]   [facility] [address] [place], [place], [place] [phone]   [facility] [address]  [place], [place] [phone]   [facility] - Intensive Outpatient [address] Laurell Josephs [address], [place], [place] [phone]   [facility] ([facility] [address] [place]  [place], [place] [phone] or [phone]   [facility] ([facility]) [address] [place], [place] [phone] Accepts Medicaid  [facility] [address],  [place] [phone] Substance Abuse/Addiction Treatment   [facility] Organization          Address  Phone  Notes  [facility]  [phone]   Angie Fava, PhD [address] Ervin Knack [place], [place]   [phone] or [phone]   [facility] Behavioral   [address] [place], [place] [phone]   [facility] [address], [place], [place] [phone] Insurance/Medicaid/sponsorship through [facility] and Families [address]                                    [place], [place] [phone] Therapy/tele-psych/case  [facility] [address], [place] [phone]    Dr. Lolly Mustache  [phone]   [facility]  [employer] [facility] Dept. 1) [address]  2) [address], [place] 3)  [address], [place] [phone]  [phone]   [facility] Child Abuse Hotline [phone] or [phone] (After Hours)

## 2015-06-21 NOTE — Progress Notes (Signed)
NO return call from Lowella Grip at time of pt d/c  Cm again had to explain hospice and explain palliative care to daughter Cm encouraged Bjorn Loser to state yes to palliative care and cautioned her hospice and palliative care wording can be interchanged Rhonda let Cm listen to cell voice message from J barr who states pt will be seen week after thanksgiving, encouraged ED visit for I&D of abscess, encouraging them to decide on hospice services.  Rhonda interpreted message as Lowella Grip was still in contact with [facility] and palliative care and had given another order for hospice/palliative services recently.  Explained to Bjorn Loser that Becky from [facility] did not have a MD order at this time to initiate services Cm offered to contact Rhonda at home number once Cm has heard from Lowella Grip or back to basics on call MD

## 2015-06-21 NOTE — ED Notes (Signed)
Pt c/o abscess on R buttocks x 3 days, intermittent headache x 2-3 years, and L arm pain x 3 weeks.  Pain score 10/10.  Pt reports taking hydrocodone x 1 w/o relief.  Pt was diagnosed w/ shingles and treated x 3 weeks ago.  L arm pain correlates w/ where shingles were located.

## 2015-06-21 NOTE — ED Provider Notes (Signed)
CSN: [id number]     Arrival date & time 06/21/15  1236 History   First MD Initiated Contact with Patient 06/21/15 1256     Chief Complaint  Patient presents with  . Abscess  . Headache  . Arm Pain     (Consider location/radiation/quality/duration/timing/severity/associated sxs/prior Treatment) HPI The patient is brought in by her daughter and granddaughter. The patient states most of her pain is in her left arm and shoulder. She had a bad case of shingles and was treated 3 weeks ago and continues to have a lot of pain. The lesions are healed and now there is just some dry scaling skin but she reports it still very very painful. So reports she has a lot of itching. Her daughter also inquires about a headache. The patient has been having headaches for a number of years. The patient does not focus on her headache related pain. She has not had fever or vomiting. The patient at baseline is completely bedridden. She is cared for in her home by her daughter. Her daughters other concern is a wound on her buttock. She reports that occasionally she'll get a sore that usually she can heal by applying antibiotic ointment and dressings. She reports this one feels more firm and she is concerned that it is an abscess. She does report her mother is resistant to being rolled up on her side and does frequently lie flat on her butt. She does report she has Vicodin to take for pain. She reports that the Vicodin however does not seem to be controlling her pain any longer. Her daughter estimates she takes about 4 day. Her daughter reports she has a regimen for constipation. She reports that every other day she doesn't suppository to keep her bowels moving. The other primary concern today is that her doctor has recommended her for hospice reportedly for CHF. Her daughter reports however her mother said she is not ready to die and they think she should have palliative care rather than hospice. She reports that she's been  having difficulty communicating with her doctor and is not sure what to do about this. Past Medical History  Diagnosis Date  . Rheumatoid arthritis (HCC)   . DJD (degenerative joint disease)   . Diabetes mellitus without complication (HCC)   . Hypertension   . Irregular heart beat   . Glaucoma   . Gout    Past Surgical History  Procedure Laterality Date  . Cataract extraction    . Abdominal hysterectomy    . Carpel tunnel    . Appendectomy     History reviewed. No pertinent family history. Social History  Substance Use Topics  . Smoking status: Current Every Day Smoker  . Smokeless tobacco: None  . Alcohol Use: No   OB History    No data available     Review of Systems  10 Systems reviewed and are negative for acute change except as noted in the HPI.   Allergies  Levaquin and Sulfa antibiotics  Home Medications   Prior to Admission medications   Medication Sig Start Date End Date Taking? Authorizing Provider  allopurinol (ZYLOPRIM) 100 MG tablet Take 100 mg by mouth daily.   Yes Historical Provider, MD  amLODipine (NORVASC) 10 MG tablet Take 10 mg by mouth daily.   Yes Historical Provider, MD  bisacodyl (DULCOLAX) 10 MG suppository Place 10 mg rectally every other day.    Yes Historical Provider, MD  colchicine 0.6 MG tablet Take 0.6 mg by  mouth daily as needed (gout flare ups).    Yes Historical Provider, MD  gabapentin (NEURONTIN) 100 MG capsule Take 1 capsule by mouth daily as needed. pain 05/30/15  Yes Historical Provider, MD  HYDROcodone-acetaminophen (NORCO/VICODIN) 5-325 MG tablet Take 1 tablet by mouth every 4 (four) hours as needed. pain 06/05/15  Yes Historical Provider, MD  magnesium hydroxide (MILK OF MAGNESIA) 400 MG/5ML suspension Take 30 mLs by mouth every other day.   Yes Historical Provider, MD  metoprolol (LOPRESSOR) 50 MG tablet Take 1 tablet by mouth daily. 04/24/15  Yes Historical Provider, MD  Polyethyl Glycol-Propyl Glycol (SYSTANE OP) Place 1  drop into both eyes at bedtime as needed (dry eyes).   Yes Historical Provider, MD  potassium chloride (K-DUR,KLOR-CON) 10 MEQ tablet Take 10 mEq by mouth daily.    Yes Historical Provider, MD  docusate sodium (COLACE) 100 MG capsule Take 1 capsule (100 mg total) by mouth every 12 (twelve) hours. Take daily to avoid constipation with narcotics use. 06/21/15   Arby Barrette, MD  mupirocin cream (BACTROBAN) 2 % Apply 1 application topically 2 (two) times daily. 06/21/15   Arby Barrette, MD  oxyCODONE-acetaminophen (PERCOCET/ROXICET) 5-325 MG tablet Take 1-2 tablets by mouth every 4 (four) hours as needed for severe pain. 06/21/15   Arby Barrette, MD   BP 117/75 mmHg  Pulse 75  Temp(Src) 97.7 F (36.5 C) (Oral)  Resp 20  SpO2 100% Physical Exam  Constitutional:  Patient actually has fairly well appearance. She is alert. Her color is good. She has no respiratory distress. She is thin and deconditioned but not cachectic.  HENT:  Head: Normocephalic and atraumatic.  Right Ear: External ear normal.  Left Ear: External ear normal.  Nose: Nose normal.  Mouth/Throat: Oropharynx is clear and moist.  Eyes: EOM are normal. Pupils are equal, round, and reactive to light.  Neck: Neck supple.  Cardiovascular: Normal rate and regular rhythm.   2/6 systolic ejection murmur.  Pulmonary/Chest: Effort normal and breath sounds normal.  Abdominal: Soft. Bowel sounds are normal. She exhibits no distension. There is no tenderness.  Genitourinary:  On patient's left buttock there is a approximately 1 cm fibrinous eschar with a rim of surrounding erythema approximately 2-1/2 cm. This is localized and firm. With pressure there does appear to be a small amount of serous slightly purulent the remainder the condition of the buttock is good. discharge. A possible small fluctuance to the top.  Musculoskeletal:  Patient's legs have muscular atrophy but otherwise are in good condition. The skin condition is good  without peripheral edema.  Neurological: She is alert.  Patient is alert and interactive. Her speech is very oriented to situation. She uses her upper extremities to help and repositioning. She has some limited use of the lower extremities as well.  Skin: Skin is warm and dry.  Examination airway the patient previously had shingles, there is some dryness and hyperpigmentation of the skin on the axilla. All of this is however quite well healed without areas of erythema or evidence of secondary infection.  Psychiatric: She has a normal mood and affect.    ED Course  Procedures (including critical care time) Incision and drainage Area was prepped and draped with Betadine prep. 11 blade was used to make 1 cm incision into the center of the lesion on her buttock. Very small amount of serous slightly febrile illness material extruded. With pressure and extending the depth of the incision slightly, no significant amount of material was discharged.  Finding was most consistent with an indurated nodule with a fibrinous surface changes. Most suggestive of a cyst like structure with overlying pressure changes. Labs Review Labs Reviewed - No data to display  Imaging Review No results found. I have personally reviewed and evaluated these images and lab results as part of my medical decision-making.   EKG Interpretation None      MDM   Final diagnoses:  Chronic pain of left upper extremity  Buttock wound, left, initial encounter   Case management was consult to discuss the patient's concerns regarding hospice versus palliative care. Today the patient's general condition appears well. There are things address with chronic pain of her left upper extremity after an episode of shingles. She will be transitioned to as needed Percocet. Her daughter is already doing bowel care with suppositories. She is advised to use Colace twice daily while using chronic narcotic pain control. Regarding her wound on  the buttock. The patient will be managed with dressings and trying to avoid any weight on the area. Instructions are provided to call wound care for additional management and hopefully assistance with in-home care through either palliative care or hospice as determined with the help of the case manager.    Arby Barrette, MD 06/21/15 [phone]

## 2015-06-21 NOTE — Progress Notes (Addendum)
   06/21/15 0000  CM Assessment  Expected Discharge Plan Home w Hospice Care  In-house Referral NA  Discharge Planning Services CM Consult  [facility]  Choice offered to / list presented to  Patient  Status of Service Completed, signed off  Discharge Disposition Home w Hospice Care    81 yr old blue medicare pt being followed by Marletta [facility] of [place]  Dtr, [place] and grand daughter, Marchelle Folks, at bedside report lack communication with initiation of hospice/palliative services for pt  States they have been informed hospice/palliative services for CHF but Cm and EDP not noting CHF on PMH. Dtr states calls and message for pcp with last call from Teola Bradley stating "need to make up your mind" Bjorn Loser states she has been contacted by [facility] but they all live in [place]   CM reviewed in details medicare guidelines, home health [facility]) (length of stay in home, types of [facility] staff available, coverage, primary caregiver, up to 24 hrs before services may be started) and Private duty nursing (PDN-coverage, length of stay in the home types of staff available). CM reviewed availability of HH SW to assist pcp to get pt to snf (if desired disposition) from the community level. CM provided pt/family with a list of [place] home health agencies and PDN.   Explained to pt, dtr and grand daughter that the EDP will not initiate hospice/palliative care This initiation is preferred by the pcp   EDP Pfeiffer in to do I&D when CM left 1510 Cm left a voice message for the on call MD for [facility]let[facility] Lor services [phone] Pending return call [phone] [phone] - Kriste Basque confirmed a MD order & referral was provided 05/30/15- contact made with family with a refusal of services noted on 06/01/15 "My mother is doing fine" Kriste Basque states an order will be needed from [facility] to initiate services, further assessment to see if pt is a candidate. Reports last referral  was based on "failure to thrive"

## 2015-12-11 IMAGING — CT CT CHEST W/O CM
2 of 4 series · 17 of 46 positions shown, 19 images · IV contrast (READICAT/WATER)
Comparison: None.

Chest radiograph 06/04/2013

CLINICAL DATA: 80 lb weight loss in 1 year with hypercalcemia,
smoker, evaluate for malignancy

EXAM:
CT CHEST AND ABDOMEN WITHOUT CONTRAST
TECHNIQUE: Multidetector CT imaging of the chest and abdomen was performed
following the standard protocol without intravenous contrast.

[Series 2: chest & abd w/o · axial · non-contrast · 0.70mm/px · z∈[-433,-43]mm · 14 of 88 slices shown, 16 images]
[im 5/88  soft-tissue]
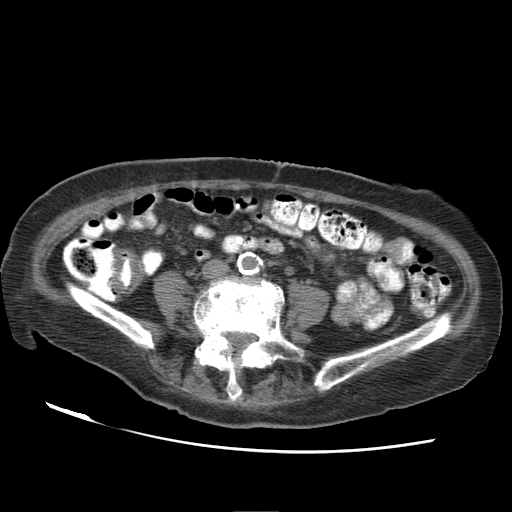
[im 5/88  bone]
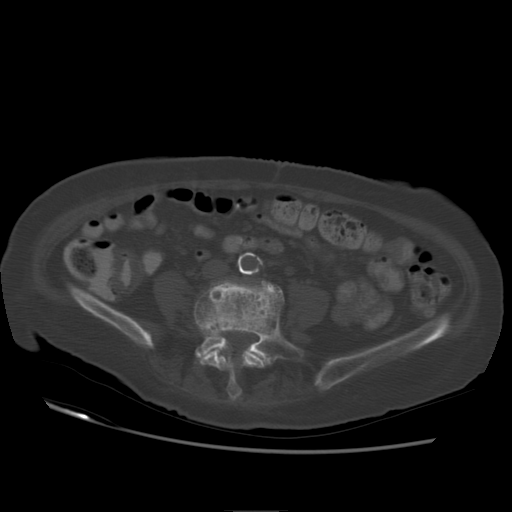
[im 10/88  soft-tissue]
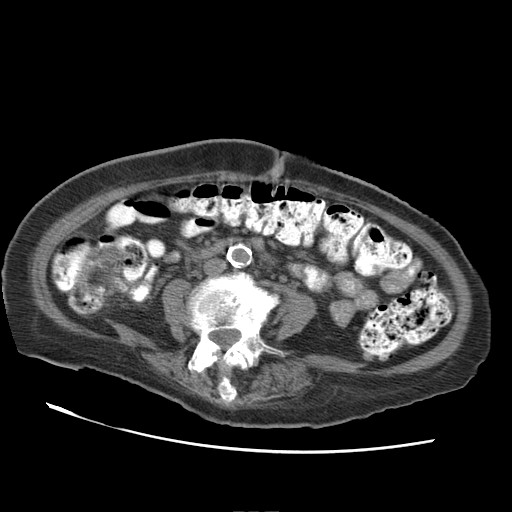
[im 20/88  soft-tissue]
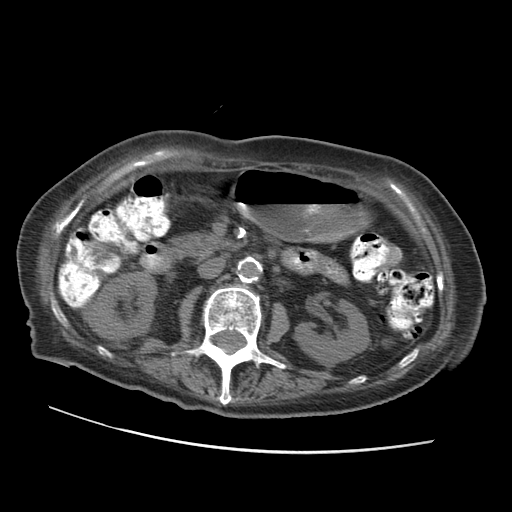
[im 25/88  soft-tissue]
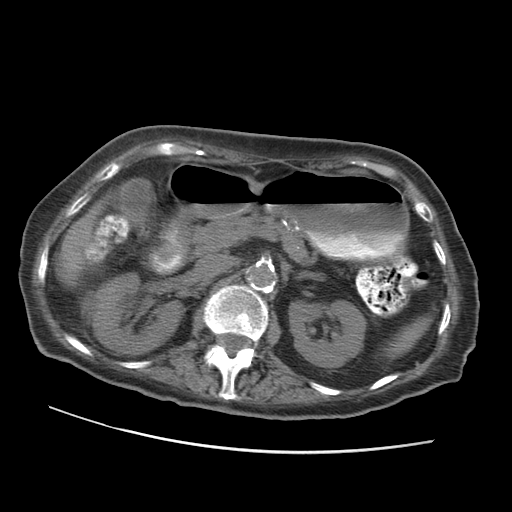
[im 30/88  soft-tissue]
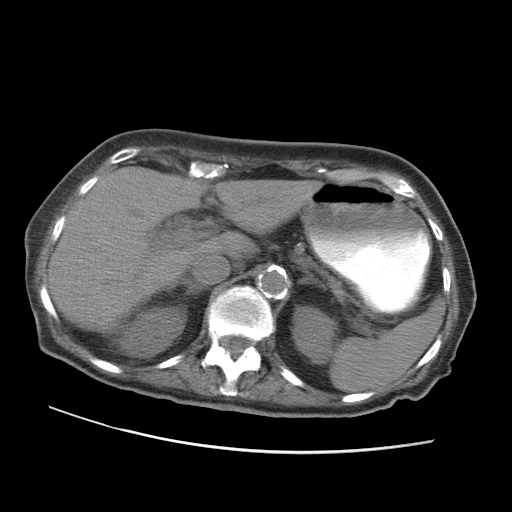
[im 34/88  soft-tissue]
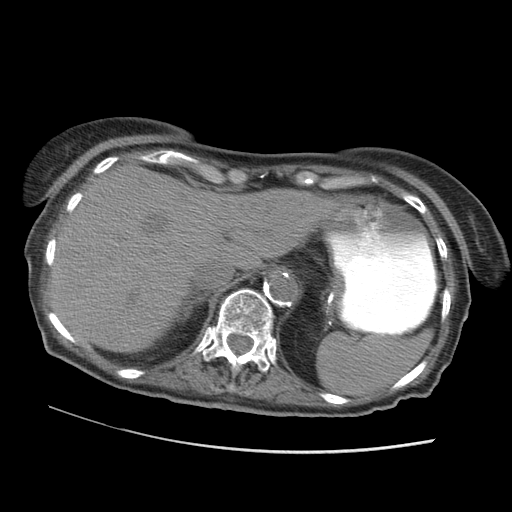
[im 39/88  soft-tissue]
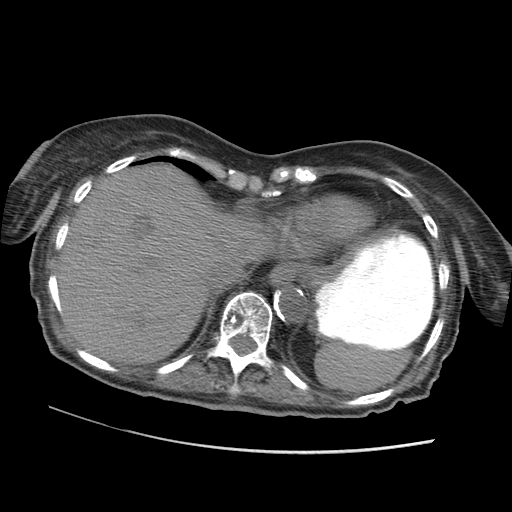
[im 49/88  soft-tissue]
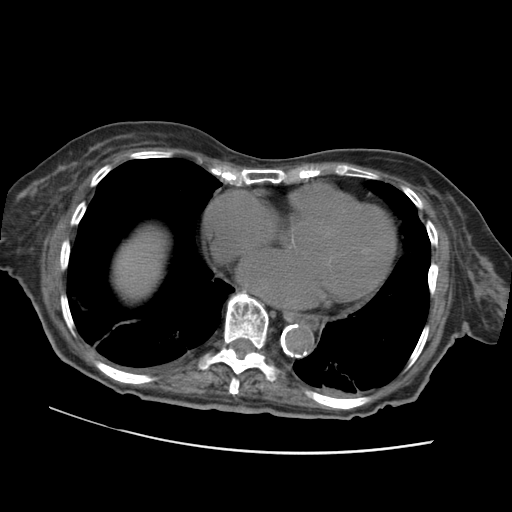
[im 54/88  soft-tissue]
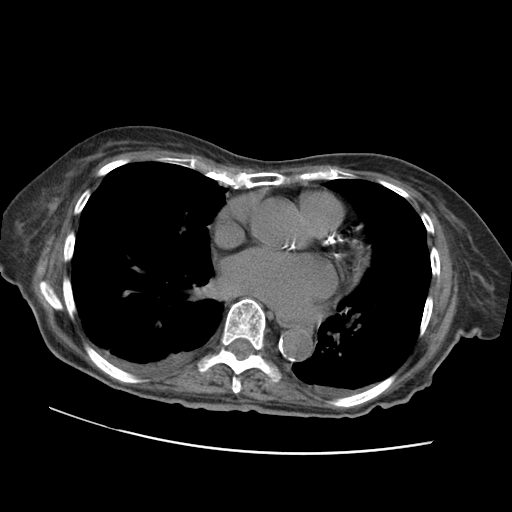
[im 54/88  bone]
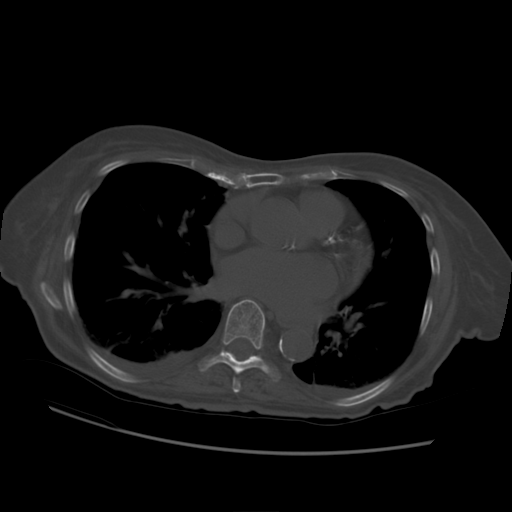
[im 59/88  soft-tissue]
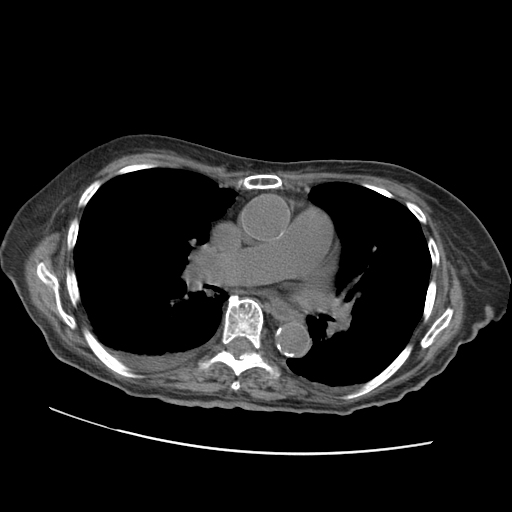
[im 63/88  soft-tissue]
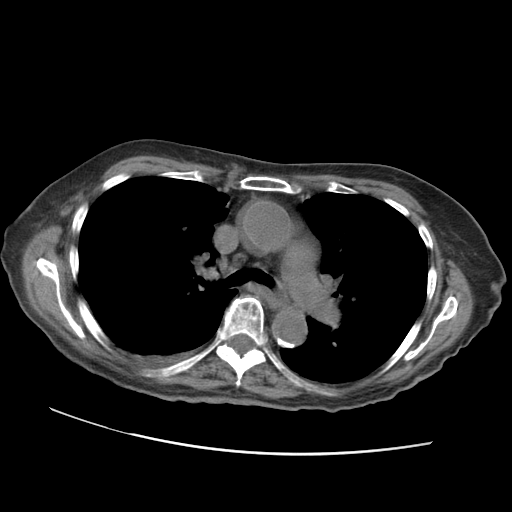
[im 68/88  soft-tissue]
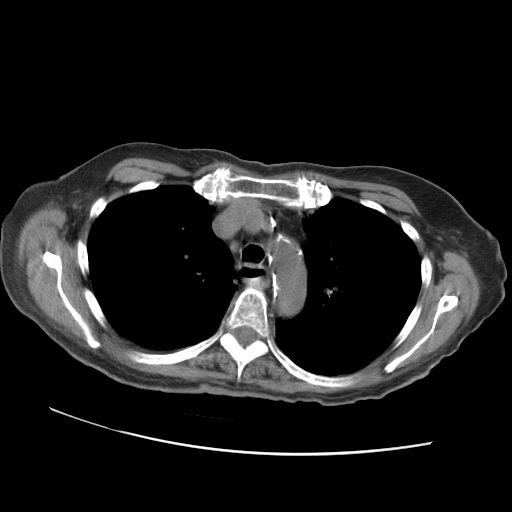
[im 78/88  soft-tissue]
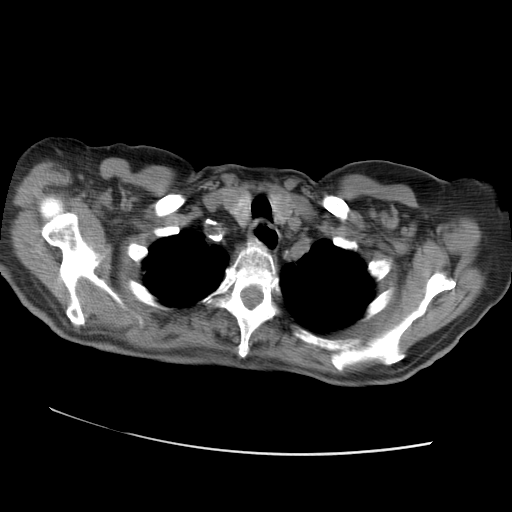
[im 83/88  soft-tissue]
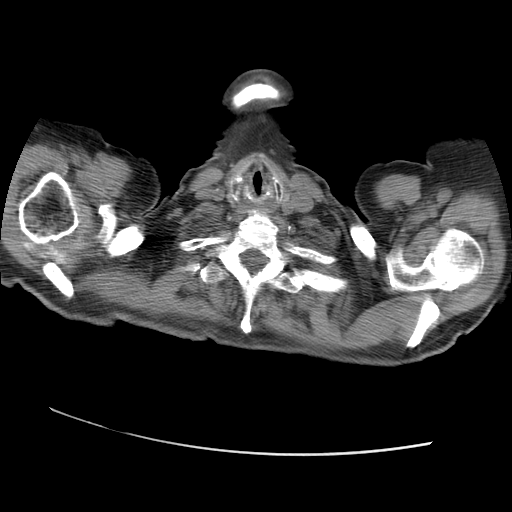

[Series 400: cor · coronal · 0.87mm/px · 3 of 109 slices shown]
[im 37/109  soft-tissue]
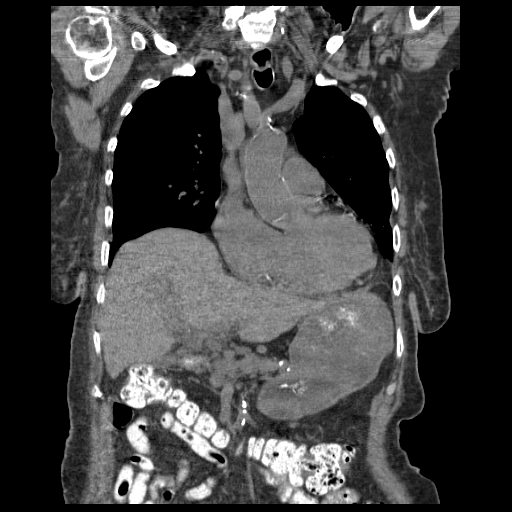
[im 49/109  soft-tissue]
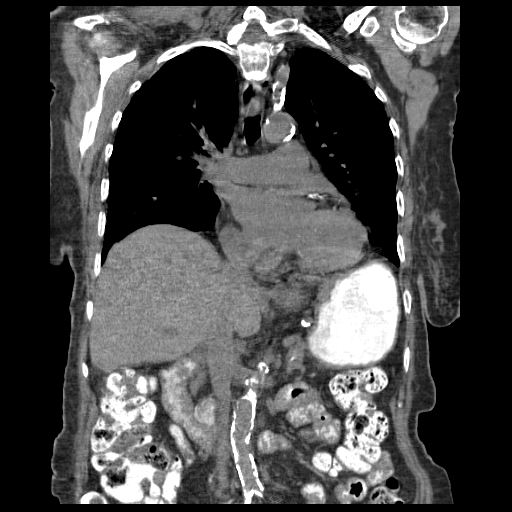
[im 61/109  soft-tissue]
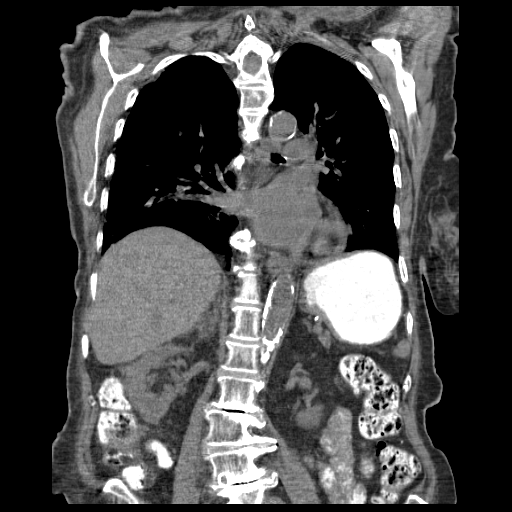

[17 of 46 positions shown; findings below may reference images not displayed]

FINDINGS: CT CHEST FINDINGS

Moderate cardiac enlargement. Minimal pericardial effusion. Moderate
coronary arterial calcification. Moderate thoracic aortic
calcification. Very small bilateral pleural effusions with bilateral
lower lobe atelectasis. Central vascular congestion. Very mild
interstitial prominence. 3 mm pulmonary nodule image number 29 right
lower lobe superior segment. No hilar or mediastinal adenopathy of
significance. Thoracic inlet normal. Numerous subcentimeter thyroid
nodules.

CT ABDOMEN FINDINGS

Limited evaluation without IV contrast. Allowing for this, liver and
gallbladder are normal. Spleen is normal. No evidence of adrenal
mass on either side with mild bilateral adrenal fullness. Kidneys
normal except for 1 cm nonobstructing left lower pole stone. Heavy
calcification of the aorta.

Visualized bowel appears normal.

No acute musculoskeletal findings in the thorax or abdomen.
IMPRESSION: Cardiac enlargement with very mild congestive heart failure.

3 mm right pulmonary nodule. If the patient is at high risk for
bronchogenic carcinoma, follow-up chest CT at 1 year is recommended.
If the patient is at low risk, no follow-up is needed. This
recommendation follows the consensus statement: Guidelines for
Management of Small Pulmonary Nodules Detected on CT Scans: A
Statement from the [HOSPITAL] as published in Radiology

## 2016-05-01 ENCOUNTER — Ambulatory Visit (INDEPENDENT_AMBULATORY_CARE_PROVIDER_SITE_OTHER): Payer: Medicare Other | Admitting: Internal Medicine

## 2016-05-01 ENCOUNTER — Encounter: Payer: Self-pay | Admitting: Internal Medicine

## 2016-05-01 VITALS — BP 118/70 | HR 66

## 2016-05-01 DIAGNOSIS — E213 Hyperparathyroidism, unspecified: Secondary | ICD-10-CM

## 2016-05-01 LAB — PHOSPHORUS: Phosphorus: 3.3 mg/dL (ref 2.3–4.6)

## 2016-05-01 LAB — BASIC METABOLIC PANEL WITH GFR
BUN: 21 mg/dL (ref 7–25)
CALCIUM: 11.1 mg/dL — AB (ref 8.6–10.4)
CHLORIDE: 104 mmol/L (ref 98–110)
CO2: 24 mmol/L (ref 20–31)
CREATININE: 1.37 mg/dL — AB (ref 0.60–0.88)
GFR, EST NON AFRICAN AMERICAN: 35 mL/min — AB (ref >=60)
GFR, Est African American: 40 mL/min — ABNORMAL LOW (ref >=60)
Glucose, Bld: 184 mg/dL — ABNORMAL HIGH (ref 65–99)
Potassium: 4.3 mmol/L (ref 3.5–5.3)
SODIUM: 138 mmol/L (ref 135–146)

## 2016-05-01 LAB — MAGNESIUM: Magnesium: 2.3 mg/dL (ref 1.5–2.5)

## 2016-05-01 LAB — VITAMIN D 25 HYDROXY (VIT D DEFICIENCY, FRACTURES): VITD: 28.98 ng/mL — AB (ref 30.00–100.00)

## 2016-05-01 NOTE — Patient Instructions (Addendum)
Please stop at the lab.  We may need to check a 24h urine collection for calcium.  Patient information (Up-to-Date): Collection of a 24-hour urine specimen   - You should collect every drop of urine during each 24-hour period. It does not matter how much or little urine is passed each time, as long as every drop is collected. - Begin the urine collection in the morning after you wake up, after you have emptied your bladder for the first time. - Urinate (empty the bladder) for the first time and flush it down the toilet. Note the exact time (eg, 6:15 AM). You will begin the urine collection at this time. - Collect every drop of urine during the day and night in an empty collection bottle. Store the bottle at room temperature or in the refrigerator. - If you need to have a bowel movement, any urine passed with the bowel movement should be collected. Try not to include feces with the urine collection. If feces does get mixed in, do not try to remove the feces from the urine collection bottle. - Finish by collecting the first urine passed the next morning, adding it to the collection bottle. This should be within ten minutes before or after the time of the first morning void on the first day (which was flushed). In this example, you would try to void between 6:05 and 6:25 on the second day. - If you need to urinate one hour before the final collection time, drink a full glass of water so that you can void again at the appropriate time. If you have to urinate 20 minutes before, try to hold the urine until the proper time. - Please note the exact time of the final collection, even if it is not the same time as when collection began on day 1. - The bottle(s) may be kept at room temperature for a day or two, but should be kept cool or refrigerated for longer periods of time.   Please come back for a follow-up appointment in 4 months.  Hypercalcemia Hypercalcemia is having too much calcium in the blood.  The body needs calcium to make bones and keep them strong. Calcium also helps the muscles, nerves, brain, and heart work the way they should. Most of the calcium in the body is in the bones. There is also some calcium in the blood. Hypercalcemia can happen when calcium comes out of the bones, or when the kidneys are not able to remove calcium from the blood. Hypercalcemia can be mild or severe. CAUSES There are many possible causes of hypercalcemia. Common causes include:  Hyperparathyroidism. This is a condition in which the body produces too much parathyroid hormone. There are four parathyroid glands in your neck. These glands produce a chemical messenger (hormone) that helps the body absorb calcium from foods and helps your bones release calcium.  Certain kinds of cancer, such as lung cancer, breast cancer, or myeloma. Less common causes of hypercalcemia include:   Getting too much calcium or vitamin D from your diet.  Kidney failure.  Hyperthyroidism.  Being on bed rest for a long time.  Certain medicines.  Infections.  Sarcoidosis. RISK FACTORS This condition is more likely to develop in:  Women.  People who are 60 years or older.  People who have a family history of hypercalcemia. SYMPTOMS  Mild hypercalcemia that starts slowly may not cause symptoms. Severe, sudden hypercalcemia is more likely to cause symptoms, such as:  Loss of appetite.  Increased  thirst and frequent urination.  Fatigue.  Nausea and vomiting.  Headache.  Abdominal pain.  Muscle pain, twitching, or weakness.  Constipation.  Blood in the urine.  Pain in the side of the back (flank pain).  Anxiety, confusion, or depression.  Irregular heartbeat (arrhythmia).  Loss of consciousness. DIAGNOSIS  This condition may be diagnosed based on:   Your symptoms.  Blood tests.  Urine tests.  X-rays.  Ultrasound.  MRI.  CT scan. TREATMENT  Treatment for hypercalcemia depends on the  cause. Treatment may include:  Receiving fluids through an IV tube.  Medicines that keep calcium levels steady after receiving fluids (loop diuretics).  Medicines that keep calcium in your bones (bisphosphonates).  Medicines that lower the calcium level in your blood.  Surgery to remove overactive parathyroid glands. HOME CARE INSTRUCTIONS  Take over-the-counter and prescription medicines only as told by your health care provider.  Follow instructions from your health care provider about eating or drinking restrictions.  Drink enough fluid to keep your urine clear or pale yellow.  Stay active. Weight-bearing exercise helps to keep calcium in your bones. Follow instructions from your health care provider about what type and level of exercise is safe for you.  Keep all follow-up visits as told by your health care provider. This is important. SEEK MEDICAL CARE IF:  You have a fever.  You have flank or abdominal pain that is getting worse. SEEK IMMEDIATE MEDICAL CARE IF:   You have severe abdominal or flank pain.  You have chest pain.  You have trouble breathing.  You become very confused and sleepy.  You lose consciousness.   This information is not intended to replace advice given to you by your health care provider. Make sure you discuss any questions you have with your health care provider.   Document Released: 10/05/2004 Document Revised: 04/12/2015 Document Reviewed: 12/07/2014 [employer] Interactive Patient Education [employer]

## 2016-05-01 NOTE — Progress Notes (Addendum)
Patient ID: Rhonda Dudley, female   DOB: 07-01-1928, 82 y.o.   MRN: [id number]    HPI  Rhonda Dudley is a 82 y.o.-year-old female, referred by her PCP, Dr. Ledora Bottcher, for management of hypercalcemia/hyperparathyroidism. Pt moved in with daughter 3-4 years ago. Her daughter accompanies here today, as patient is mostly nonverbal.  Pt was told that she has hypercalcemia this year, however, per review of chart, her calcium has been elevated at least since 2014. She recently changed PCPs and Dr. Neta Dudley referred her to endocrinology for management of her hypercalcemia.  I reviewed pt's pertinent labs: 01/17/2016: Calcium 11.7, intact PTH 125 11/30/2015: Calcium 11.7, EGFR 45 Lab Results  Component Value Date   CALCIUM 11.3 (H) 05/21/2015   CALCIUM 13.0 (H) 06/04/2013   No h/o adult fractures. No known h/o OP.  + remote h/o 1x kidney stone.  + h/o CKD. Last BUN/Cr: Lab Results  Component Value Date   BUN 30 (H) 05/21/2015   BUN 55 (H) 06/04/2013   CREATININE 1.63 (H) 05/21/2015   CREATININE 1.60 (H) 06/04/2013   Pt is not on HCTZ.  No h/o vitamin D deficiency. No vit D levels available for review No results found for: VD25OH  Pt is on calcium and vitamin D; she also eats dairy and green, leafy, vegetables.  Unknown FH of hypercalcemia, pituitary tumors, thyroid cancer, or osteoporosis. Pt was raised in an orphanage, as her parents died when she was very young.  Pt. also has a history of diet-controlled diabetes, HTN. She also has A fib. She had an episode of shingles, after which she has residual neuropathic pain.  ROS: Constitutional: + weight gain, +  fatigue, + subjective hypothermia Eyes: + blurry vision, no xerophthalmia ENT: + sore throat, no nodules palpated in throat, + occasional dysphagia/no odynophagia, + hoarseness, + decreased hearing, + tinnitus Cardiovascular: + CP/+ SOB/palpitations/+ leg swelling Respiratory: no cough/+ SOB/+ wheezing Gastrointestinal: +  N/no V/D/+ C Musculoskeletal: + Both: muscle/joint aches Skin: no rashes, + easy bruising, + itching, + hair loss Neurological: no tremors/numbness/tingling/dizziness, + headache Psychiatric: no depression/anxiety  Past Medical History:  Diagnosis Date  . Diabetes mellitus without complication ([place])   . DJD (degenerative joint disease)   . Glaucoma   . Gout   . Hypertension   . Irregular heart beat   . Rheumatoid arthritis [facility])    Past Surgical History:  Procedure Laterality Date  . ABDOMINAL HYSTERECTOMY    . APPENDECTOMY    . carpel tunnel    . CATARACT EXTRACTION     Social History   Social History  . Marital status: Widowed     Spouse name: N/A  . Number of children: 2   Occupational History  . Retired    Social History Main Topics  . Smoking status: Current Every Day Smoker - Half a pack   . Smokeless tobacco: Not on file  . Alcohol use No  . Drug use: No   Current Outpatient Prescriptions on File Prior to Visit  Medication Sig Dispense Refill  . allopurinol (ZYLOPRIM) 100 MG tablet Take 100 mg by mouth daily.    Marland Kitchen amLODipine (NORVASC) 10 MG tablet Take 10 mg by mouth daily.    . bisacodyl (DULCOLAX) 10 MG suppository Place 10 mg rectally every other day.     . colchicine 0.6 MG tablet Take 0.6 mg by mouth daily as needed (gout flare ups).     . gabapentin (NEURONTIN) 100 MG capsule Take 1 capsule  by mouth daily as needed. pain  5  . HYDROcodone-acetaminophen (NORCO/VICODIN) 5-325 MG tablet Take 1 tablet by mouth every 4 (four) hours as needed. pain  0  . magnesium hydroxide (MILK OF MAGNESIA) 400 MG/5ML suspension Take 30 mLs by mouth every other day.    . metoprolol (LOPRESSOR) 50 MG tablet Take 1 tablet by mouth daily.  2  . mupirocin cream (BACTROBAN) 2 % Apply 1 application topically 2 (two) times daily. 15 g 0  . oxyCODONE-acetaminophen (PERCOCET/ROXICET) 5-325 MG tablet Take 1-2 tablets by mouth every 4 (four) hours as needed for severe pain. 6 tablet 0   . Polyethyl Glycol-Propyl Glycol (SYSTANE OP) Place 1 drop into both eyes at bedtime as needed (dry eyes).    . potassium chloride (K-DUR,KLOR-CON) 10 MEQ tablet Take 10 mEq by mouth daily.     Marland Kitchen docusate sodium (COLACE) 100 MG capsule Take 1 capsule (100 mg total) by mouth every 12 (twelve) hours. Take daily to avoid constipation with narcotics use. (Patient not taking: Reported on 05/01/2016) 60 capsule 0   No current facility-administered medications on file prior to visit.    Allergies  Allergen Reactions  . Valacyclovir Other (See Comments)  . Levaquin [Levofloxacin In D5w] Nausea And Vomiting  . Sulfa Antibiotics Nausea And Vomiting   Family history of HTN, HL in daughter.  PE: BP 118/70 (BP Location: Left Arm, Patient Position: Sitting)   Pulse 66   SpO2 97%  Wt Readings from Last 3 Encounters:  06/20/15 120 lb (54.4 kg)  05/21/15 120 lb (54.4 kg)  06/04/13 200 lb (90.7 kg)   Constitutional: Thin, in NAD, In wheelchair, nonverbal Eyes: PERRLA, EOMI, no exophthalmos ENT: moist mucous membranes, no thyromegaly, but lumpy bumpy thyroid, no cervical lymphadenopathy Cardiovascular: RRR, No MRG Respiratory: CTA B Gastrointestinal: abdomen soft, NT, ND, BS+ Musculoskeletal: no deformities, strength intact in all 4 Skin: moist, warm, no rashes Neurological: + tremor with outstretched hands, DTR normal in all 4  Assessment: 1. Hypercalcemia/hyperparathyroidism  Plan: Patient has had several instances of elevated calcium, with the highest level being at 13. A recent intact PTH level was also high, at 125, for a calcium of 11.7. She has decreased kidney function, - Patient is not aware of a history of vitamin D deficiency, but I do not have any levels for review. - Possible complications from hypercalcemia: She has a distant h/o nephrolithiasis x1, no known osteoporosis (she had a fracture at 82 years old). She has occasional abdominal pain, nausea, bone pain. She also has  confusion and appears depressed. - I discussed with the patient and her daughter about the physiology of calcium and parathyroid hormone, and possible side effects from increased PTH, including kidney stones, osteoporosis, abdominal pain, etc.  - We discussed that we need to check whether her hyperparathyroidism is primary (Familial hypercalcemic hypocalciuria or parathyroid adenoma) or secondary (to conditions like: vitamin D deficiency, calcium malabsorption, hypercalciuria, renal insufficiency, etc.). - I discussed with her that we first need to bring her vitamin D level to normal so we can further investigate the parathyroid status. I explained that in the setting of a low vitamin D, the parathyroid hormone can be elevated, which is not a pathologic finding. However, if the PTH is elevated in the setting of a normal vitamin D, we will further need to investigate her for primary or secondary  hyperparathyroidism. My suspicion is for primary hyperparathyroidism - after we normalize the vitamin D level, we'll need to check: calcium level  intact PTH ([facility]) Magnesium Phosphorus vitamin D- 25 HO and 1,25 HO 24h urinary calcium/creatinine ratio - given instructions for urine collection in case we need to check this - We discussed possible consequences of hyperparathyroidism: ~1/3 pts will develop complications over 15 years (OP, nephrolithiasis).  - If the tests indicate a parathyroid adenoma, I suggested a referral to surgery. She does meet criteria for parathyroid surgery:  Increased calcium by more than 1 mg/dL above the upper limit of normal  Kidney ds.  Osteoporosis (or Vb fx) Age <24 years old + (2013): High UCa >400 mg/d and increased stone risk by biochemical stone risk analysis Presence of nephrolithiasis or nephrocalcinosis Pt's preference!  - However, due to her advanced age, her atrial fibrillation, and degree of deconditioning, we need to be very careful in suggesting surgery. We  discussed about other possible treatments: Osteoporosis medicine (will may need to check a DEXA scan before starting this (+ add a 33% distal radius for evaluation of cortical bone, which is predominantly affected by hyperparathyroidism), or even Cinacalcet, although this may be expensive and not as efficient in decreasing the rate of complications as surgery.  - I will see the patient back in 4 months  - time spent with the patient and her daughter: 1 hour, of which >50% was spent in obtaining information about her symptoms, reviewing her previous labs, evaluations, and treatments, counseling her about her condition (please see the discussed topics above), and developing a plan to further investigate it; her daughter had a number of questions which I addressed.  Component     Latest Ref Rng & Units 05/01/2016  VITD     30.00 - 100.00 ng/mL 28.98 (L)  PTH     15 - 65 pg/mL 120 (H)  Phosphorus     2.3 - 4.6 mg/dL 3.3  Magnesium     1.5 - 2.5 mg/dL 2.3   Vitamin D slightly low >> Will need to increase her vitamin D level. They were first need to check at home to see how much vitamin D she is taking. PTH is high. Phosphorus and magnesium are normal.   Component     Latest Ref Rng & Units 05/01/2016  Sodium     135 - 146 mmol/L 138  Potassium     3.5 - 5.3 mmol/L 4.3  Chloride     98 - 110 mmol/L 104  CO2     20 - 31 mmol/L 24  Glucose     65 - 99 mg/dL 184 (H)  BUN     7 - 25 mg/dL 21  Creatinine     0.60 - 0.88 mg/dL 1.37 (H)  Calcium     8.6 - 10.4 mg/dL 11.1 (H)  GFR, Est African American     >=60 mL/min 40 (L)  GFR, Est Non African American     >=60 mL/min 35 (L)  Vitamin D 1, 25 (OH) Total     18 - 72 pg/mL 9 (L)  Vitamin D3 1, 25 (OH)     pg/mL 9  Vitamin D2 1, 25 (OH)     pg/mL <8   Elevated calcium, with surprisingly low calcitriol. We'll refer her to Dr Harlow Asa to see if surgery is feasible for her.  Philemon Kingdom, MD PhD [facility] Endocrinology

## 2016-05-02 LAB — PARATHYROID HORMONE, INTACT (NO CA): PTH: 120 pg/mL — ABNORMAL HIGH (ref 15–65)

## 2016-05-03 DIAGNOSIS — E213 Hyperparathyroidism, unspecified: Secondary | ICD-10-CM | POA: Insufficient documentation

## 2016-05-04 LAB — VITAMIN D 1,25 DIHYDROXY
VITAMIN D 1, 25 (OH) TOTAL: 9 pg/mL — AB (ref 18–72)
Vitamin D3 1, 25 (OH)2: 9 pg/mL

## 2016-05-07 ENCOUNTER — Telehealth: Payer: Self-pay | Admitting: Internal Medicine

## 2016-05-07 ENCOUNTER — Other Ambulatory Visit: Payer: Self-pay | Admitting: Internal Medicine

## 2016-05-07 ENCOUNTER — Telehealth: Payer: Self-pay

## 2016-05-07 DIAGNOSIS — E210 Primary hyperparathyroidism: Secondary | ICD-10-CM

## 2016-05-07 NOTE — Telephone Encounter (Signed)
Pt daughter Bjorn Loser returning phone call, [phone]

## 2016-05-07 NOTE — Telephone Encounter (Signed)
Called and spoke with Bjorn Loser the daughter, advised of lab results, and of the need for vitamin D as patient is not taking any currently; but will start the 1000 units as requested by Dr.Gherghe. Also patient said they would talk to someone about surgery, so a referral is needed.

## 2016-05-07 NOTE — Telephone Encounter (Signed)
Called patient, and spoke with her about labs, patient confused at this time, asked if I could call her daughter and discuss labs with her as she would know what all she was taking at this time. I called daughter Rhonda Dudley and left message to return call for her mother lab results. Gave call back number.

## 2016-05-07 NOTE — Telephone Encounter (Signed)
I put the referral in to see Dr. Gerrit Friends.

## 2016-06-08 ENCOUNTER — Encounter (HOSPITAL_COMMUNITY): Payer: Self-pay | Admitting: *Deleted

## 2016-06-08 ENCOUNTER — Observation Stay (HOSPITAL_COMMUNITY)
Admission: EM | Admit: 2016-06-08 | Discharge: 2016-06-09 | Disposition: A | Discharge status: Home / Self Care | Payer: Medicare Other | Attending: Family Medicine | Admitting: Family Medicine

## 2016-06-08 DIAGNOSIS — N183 Chronic kidney disease, stage 3 unspecified: Secondary | ICD-10-CM | POA: Diagnosis present

## 2016-06-08 DIAGNOSIS — I129 Hypertensive chronic kidney disease with stage 1 through stage 4 chronic kidney disease, or unspecified chronic kidney disease: Secondary | ICD-10-CM | POA: Insufficient documentation

## 2016-06-08 DIAGNOSIS — H409 Unspecified glaucoma: Secondary | ICD-10-CM | POA: Insufficient documentation

## 2016-06-08 DIAGNOSIS — Z79899 Other long term (current) drug therapy: Secondary | ICD-10-CM | POA: Insufficient documentation

## 2016-06-08 DIAGNOSIS — Z9049 Acquired absence of other specified parts of digestive tract: Secondary | ICD-10-CM | POA: Insufficient documentation

## 2016-06-08 DIAGNOSIS — Z9849 Cataract extraction status, unspecified eye: Secondary | ICD-10-CM | POA: Diagnosis not present

## 2016-06-08 DIAGNOSIS — M069 Rheumatoid arthritis, unspecified: Secondary | ICD-10-CM | POA: Diagnosis not present

## 2016-06-08 DIAGNOSIS — Z90710 Acquired absence of both cervix and uterus: Secondary | ICD-10-CM | POA: Insufficient documentation

## 2016-06-08 DIAGNOSIS — I482 Chronic atrial fibrillation, unspecified: Secondary | ICD-10-CM

## 2016-06-08 DIAGNOSIS — M109 Gout, unspecified: Secondary | ICD-10-CM | POA: Diagnosis present

## 2016-06-08 DIAGNOSIS — Z66 Do not resuscitate: Secondary | ICD-10-CM | POA: Insufficient documentation

## 2016-06-08 DIAGNOSIS — E1139 Type 2 diabetes mellitus with other diabetic ophthalmic complication: Secondary | ICD-10-CM | POA: Diagnosis not present

## 2016-06-08 DIAGNOSIS — I10 Essential (primary) hypertension: Secondary | ICD-10-CM

## 2016-06-08 DIAGNOSIS — E119 Type 2 diabetes mellitus without complications: Secondary | ICD-10-CM

## 2016-06-08 DIAGNOSIS — Z993 Dependence on wheelchair: Secondary | ICD-10-CM | POA: Diagnosis present

## 2016-06-08 DIAGNOSIS — E1122 Type 2 diabetes mellitus with diabetic chronic kidney disease: Secondary | ICD-10-CM | POA: Diagnosis not present

## 2016-06-08 DIAGNOSIS — F17200 Nicotine dependence, unspecified, uncomplicated: Secondary | ICD-10-CM | POA: Insufficient documentation

## 2016-06-08 DIAGNOSIS — M1990 Unspecified osteoarthritis, unspecified site: Secondary | ICD-10-CM | POA: Diagnosis present

## 2016-06-08 DIAGNOSIS — T50901A Poisoning by unspecified drugs, medicaments and biological substances, accidental (unintentional), initial encounter: Principal | ICD-10-CM

## 2016-06-08 HISTORY — DX: Chronic atrial fibrillation, unspecified: I48.20

## 2016-06-08 HISTORY — DX: Chronic kidney disease, stage 3 unspecified: N18.30

## 2016-06-08 HISTORY — DX: Dependence on wheelchair: Z99.3

## 2016-06-08 HISTORY — DX: Chronic kidney disease, stage 3 (moderate): N18.3

## 2016-06-08 LAB — BASIC METABOLIC PANEL
Anion gap: 7 (ref 5–15)
BUN: 26 mg/dL — AB (ref 6–20)
CO2: 27 mmol/L (ref 22–32)
CREATININE: 1.56 mg/dL — AB (ref 0.44–1.00)
Calcium: 11.7 mg/dL — ABNORMAL HIGH (ref 8.9–10.3)
Chloride: 105 mmol/L (ref 101–111)
GFR calc Af Amer: 34 mL/min — ABNORMAL LOW (ref >60)
GFR, EST NON AFRICAN AMERICAN: 29 mL/min — AB (ref >60)
Glucose, Bld: 170 mg/dL — ABNORMAL HIGH (ref 65–99)
Potassium: 3.7 mmol/L (ref 3.5–5.1)
SODIUM: 139 mmol/L (ref 135–145)

## 2016-06-08 LAB — ACETAMINOPHEN LEVEL
ACETAMINOPHEN (TYLENOL), SERUM: 12 ug/mL (ref 10–30)
Acetaminophen (Tylenol), Serum: <10 ug/mL — ABNORMAL LOW (ref 10–30)

## 2016-06-08 LAB — CBC WITH DIFFERENTIAL/PLATELET
BASOS ABS: 0.1 10*3/uL (ref 0.0–0.1)
Basophils Relative: 1 %
EOS ABS: 0.2 10*3/uL (ref 0.0–0.7)
EOS PCT: 2 %
HCT: 42.2 % (ref 36.0–46.0)
Hemoglobin: 14.2 g/dL (ref 12.0–15.0)
LYMPHS ABS: 2.1 10*3/uL (ref 0.7–4.0)
Lymphocytes Relative: 26 %
MCH: 31.9 pg (ref 26.0–34.0)
MCHC: 33.6 g/dL (ref 30.0–36.0)
MCV: 94.8 fL (ref 78.0–100.0)
MONO ABS: 0.7 10*3/uL (ref 0.1–1.0)
Monocytes Relative: 8 %
Neutro Abs: 4.9 10*3/uL (ref 1.7–7.7)
Neutrophils Relative %: 63 %
PLATELETS: 202 10*3/uL (ref 150–400)
RBC: 4.45 MIL/uL (ref 3.87–5.11)
RDW: 13.4 % (ref 11.5–15.5)
WBC: 7.9 10*3/uL (ref 4.0–10.5)

## 2016-06-08 LAB — SALICYLATE LEVEL: Salicylate Lvl: <7 mg/dL (ref 2.8–30.0)

## 2016-06-08 NOTE — ED Provider Notes (Signed)
[facility]-EMERGENCY DEPT Provider Note   CSN: [id number] Arrival date & time: 06/08/16  1955     History   Chief Complaint Chief Complaint  Patient presents with  . Ingestion    HPI Rhonda Dudley is a 82 y.o. female.  Rhonda Dudley is a 82 y.o. Female who presents to the emergency department with her family after an axonal overdose on her home medications today. Just before 7 PM tonight patient took double her home medications on accident. Family believes she was confused and forgot that she had taken her evening dose. She is supposed to take 10 mEq of potassium, amlodipine 10 mg, Lopressor 100 mg, and allopurinol 100 mg. She took double the amount of all of them. This a total of 20 mEq of potassium, 20 mg of amlodipine, 200 mg of Lopressor, and 200 mg of allopurinol. Patient denies any complaints. Family reports she is mentating at her baseline. Patient denies fevers, chest pain, shortness of breath, abdominal pain, nausea, vomiting, headaches, weakness or rashes.   The history is provided by the patient, a caregiver and a relative. No language interpreter was used.  Ingestion  Pertinent negatives include no chest pain, no abdominal pain, no headaches and no shortness of breath.    Past Medical History:  Diagnosis Date  . Chronic atrial fibrillation (HCC)   . CKD (chronic kidney disease), stage III   . Diabetes mellitus without complication (HCC)   . DJD (degenerative joint disease)   . Glaucoma   . Gout   . Hypertension   . Irregular heart beat   . Rheumatoid arthritis (HCC)   . Wheelchair bound     Patient Active Problem List   Diagnosis Date Noted  . Accidental overdose 06/09/2016  . CKD (chronic kidney disease), stage III 06/09/2016  . Gout   . DJD (degenerative joint disease)   . Chronic atrial fibrillation (HCC)   . Diabetes mellitus without complication (HCC)   . Wheelchair bound   . Essential hypertension   . Hyperparathyroidism (HCC) 05/03/2016    Past  Surgical History:  Procedure Laterality Date  . ABDOMINAL HYSTERECTOMY    . APPENDECTOMY    . carpel tunnel    . CATARACT EXTRACTION      OB History    No data available       Home Medications    Prior to Admission medications   Medication Sig Start Date End Date Taking? Authorizing Provider  acetaminophen (TYLENOL) 325 MG tablet Take 650 mg by mouth every 6 (six) hours as needed for mild pain.   Yes Historical Provider, MD  allopurinol (ZYLOPRIM) 100 MG tablet Take 100 mg by mouth daily.   Yes Historical Provider, MD  amLODipine (NORVASC) 10 MG tablet Take 10 mg by mouth daily.   Yes Historical Provider, MD  ASPERCREME W/LIDOCAINE EX Apply 1 each topically every morning.   Yes Historical Provider, MD  bisacodyl (DULCOLAX) 10 MG suppository Place 10 mg rectally every other day.    Yes Historical Provider, MD  cholecalciferol (VITAMIN D) 1000 units tablet Take 1,000 Units by mouth daily.   Yes Historical Provider, MD  colchicine 0.6 MG tablet Take 0.6 mg by mouth daily as needed (gout flare ups).    Yes Historical Provider, MD  gabapentin (NEURONTIN) 100 MG capsule Take 1 capsule by mouth daily as needed. pain 05/30/15  Yes Historical Provider, MD  magnesium hydroxide (MILK OF MAGNESIA) 400 MG/5ML suspension Take 30 mLs by mouth every other day.  Yes Historical Provider, MD  metoprolol (LOPRESSOR) 50 MG tablet Take 1 tablet by mouth 2 (two) times daily.  04/24/15  Yes Historical Provider, MD  mupirocin cream (BACTROBAN) 2 % Apply 1 application topically 2 (two) times daily. 06/21/15  Yes Arby BarretteMarcy Pfeiffer, MD  OVER THE COUNTER MEDICATION Apply 1 each topically daily.   Yes Historical Provider, MD  OVER THE COUNTER MEDICATION    Yes Historical Provider, MD  Polyethyl Glycol-Propyl Glycol (SYSTANE OP) Place 1 drop into both eyes at bedtime as needed (dry eyes).   Yes Historical Provider, MD  potassium chloride (K-DUR,KLOR-CON) 10 MEQ tablet Take 10 mEq by mouth daily.    Yes Historical  Provider, MD  docusate sodium (COLACE) 100 MG capsule Take 1 capsule (100 mg total) by mouth every 12 (twelve) hours. Take daily to avoid constipation with narcotics use. Patient not taking: Reported on 05/01/2016 06/21/15   Arby BarretteMarcy Pfeiffer, MD  oxyCODONE-acetaminophen (PERCOCET/ROXICET) 5-325 MG tablet Take 1-2 tablets by mouth every 4 (four) hours as needed for severe pain. Patient not taking: Reported on 06/08/2016 06/21/15   Arby BarretteMarcy Pfeiffer, MD    Family History No family history on file.  Social History Social History  Substance Use Topics  . Smoking status: Current Every Day Smoker  . Smokeless tobacco: Never Used  . Alcohol use No     Allergies   Valacyclovir; Levaquin [levofloxacin in d5w]; and Sulfa antibiotics   Review of Systems Review of Systems  Constitutional: Negative for chills and fever.  HENT: Negative for congestion and sore throat.   Eyes: Negative for visual disturbance.  Respiratory: Negative for cough and shortness of breath.   Cardiovascular: Negative for chest pain and palpitations.  Gastrointestinal: Negative for abdominal pain, diarrhea, nausea and vomiting.  Genitourinary: Negative for dysuria.  Musculoskeletal: Negative for back pain and neck pain.  Skin: Negative for rash.  Neurological: Negative for light-headedness and headaches.     Physical Exam Updated Vital Signs BP 123/58 (BP Location: Right Arm)   Pulse 61   Temp 98.5 F (36.9 C) (Oral)   Resp 26   SpO2 95%   Physical Exam  Constitutional: She appears well-developed and well-nourished. No distress.  Nontoxic appearing.  HENT:  Head: Normocephalic and atraumatic.  Right Ear: External ear normal.  Left Ear: External ear normal.  Mouth/Throat: Oropharynx is clear and moist.  Eyes: Conjunctivae are normal. Pupils are equal, round, and reactive to light. Right eye exhibits no discharge. Left eye exhibits no discharge.  Neck: Neck supple.  Cardiovascular: Normal rate, regular  rhythm, normal heart sounds and intact distal pulses.   Good bilateral radial pulses.  Pulmonary/Chest: Effort normal and breath sounds normal. No respiratory distress. She has no wheezes. She has no rales.  Abdominal: Soft. There is no tenderness.  Lymphadenopathy:    She has no cervical adenopathy.  Neurological: She is alert. Coordination normal.  Patient is alert and oriented to person, place and has some difficulty with time and event. Family reports she is mentating at her baseline. She is laughing and very pleasant in the room.  Skin: Skin is warm and dry. Capillary refill takes less than 2 seconds. No rash noted. She is not diaphoretic. No erythema. No pallor.  Psychiatric: She has a normal mood and affect. Her behavior is normal.  Nursing note and vitals reviewed.    ED Treatments / Results  Labs (all labs ordered are listed, but only abnormal results are displayed) Labs Reviewed  BASIC METABOLIC PANEL - Abnormal;  Notable for the following:       Result Value   Glucose, Bld 170 (*)    BUN 26 (*)    Creatinine, Ser 1.56 (*)    Calcium 11.7 (*)    GFR calc non Af Amer 29 (*)    GFR calc Af Amer 34 (*)    All other components within normal limits  ACETAMINOPHEN LEVEL - Abnormal; Notable for the following:    Acetaminophen (Tylenol), Serum <10 (*)    All other components within normal limits  CBC WITH DIFFERENTIAL/PLATELET  ACETAMINOPHEN LEVEL  SALICYLATE LEVEL  HEPATIC FUNCTION PANEL  HEMOGLOBIN A1C  BASIC METABOLIC PANEL  CBC    EKG  EKG Interpretation     ED ECG REPORT   Date: 06/08/2016  Rate: 62  Rhythm: atrial fibrillation  QRS Axis: normal  Intervals: normal  ST/T Wave abnormalities: normal  Conduction Disutrbances:none  Narrative Interpretation:   Old EKG Reviewed: unchanged  I have personally reviewed the EKG tracing and agree with the computerized printout as noted.   Radiology No results found.  Procedures Procedures (including critical  care time)  Medications Ordered in ED Medications  mupirocin cream (BACTROBAN) 2 % 1 application (not administered)  lidocaine (LMX) 4 % cream (not administered)  nicotine (NICODERM CQ - dosed in mg/24 hours) patch 21 mg (not administered)  0.9 %  sodium chloride infusion (not administered)  enoxaparin (LOVENOX) injection 40 mg (not administered)  sodium chloride flush (NS) 0.9 % injection 3 mL (not administered)  polyvinyl alcohol (LIQUIFILM TEARS) 1.4 % ophthalmic solution 1 drop (not administered)     Initial Impression / Assessment and Plan / ED Course  I have reviewed the triage vital signs and the nursing notes.  Pertinent labs & imaging results that were available during my care of the patient were reviewed by me and considered in my medical decision making (see chart for details).  Clinical Course   This is a 82 y.o. Female who presents to the emergency department with her family after an axonal overdose on her home medications today. Just before 7 PM tonight patient took double her home medications on accident. Family believes she was confused and forgot that she had taken her evening dose. She is supposed to take 10 mEq of potassium, amlodipine 10 mg, Lopressor 100 mg, and allopurinol 100 mg. She took double the amount of all of them. This a total of 20 mEq of potassium, 20 mg of amlodipine, 200 mg of Lopressor, and 200 mg of allopurinol. Patient denies any complaints. Family reports she is mentating at her baseline. On exam the patient is afebrile nontoxic appearing. Heart rate is 62. She is in A. Fib on the monitor. Patient with a history of A. Fib. Patient is alert and oriented to person and place but has some confusion about why she is in the emergency department and what year it is. Family reports she is mentating at baseline. She has no complaints. Poison control was contacted and they encouraged [place] to observe her until 7 or 8 AM the next morning.They encourage basic blood  work, EKG and monitoring. They would like a 6 hour post ingestion EKG.  BMP is around the patient's baseline. CBC is unremarkable.negative salicylate level. Initial acetaminophen level returned therapeutic at 12. Will check a 4 hour post ingestion Tylenol level to ensure this is not rising. 4 hour post ingestion Tylenol level is not detectable. Will consult for observation for the patient. Family agrees with plan.  I consulted with Dr. Clyde Lundborg who accepted the patient for observation and temp orders placed for tele.    Final Clinical Impressions(s) / ED Diagnoses   Final diagnoses:  Essential hypertension  Chronic atrial fibrillation (HCC)  Diabetes mellitus without complication [facility])  Wheelchair bound  Accidental drug overdose, initial encounter    New Prescriptions Current Discharge Medication List       Everlene Farrier, PA-C 06/09/16 0111    Nira Conn, MD 06/10/16 1327

## 2016-06-08 NOTE — H&P (Signed)
History and Physical    Rhonda Dudley MKL:[id number] DOB: 1928-03-18 DOA: 06/08/2016  Referring MD/NP/PA:   PCP: Vivien Presto, MD   Patient coming from:  The patient is coming from home.  At baseline, pt is partially dependent for most of ADL.   Chief Complaint: accidental overdose of home medications  HPI: Rhonda Dudley is a 82 y.o. female with medical history significant of hypertension, diet controlled borderline diabetes, gout, rheumatoid arthritis, DJD, tobacco abuse, A. fib not on anticoagulants, CKD-III, wheelchair-bound, who presents with accidental overdose of home medications  Per pt's daughter and granddaughter, patient took double home medications by accident. Family believes she was confused and forgot that she had taken her evening dose. She is supposed to take 10 mEq of potassium, amlodipine 10 mg, Lopressor 100 mg, and allopurinol 100 mg. She took double the amount of all of them. This a total of 20 mEq of potassium, 20 mg of amlodipine, 200 mg of Lopressor, and 200 mg of allopurinol. Pt has mild nausea, but no vomiting. Patient denies any complaints. No chest pain, shortness of breath, fever, chills, abdominal pain, vision change or hearing loss. No rashes. Family reports she is mentating at her baseline  ED Course: pt was found to have WBC 7.9, Tylenol level less than 10, salicylate level less than 7, potassium 3.7, stable renal function, temperature normal, heart rate 60s, respirations are 26, oxygenation 95% on room air. Pt is placed on tele bed for obs.  Review of Systems:   General: no fevers, chills, no changes in body weight, no fatigue HEENT: no blurry vision, hearing changes or sore throat Respiratory: no dyspnea, coughing, wheezing CV: no chest pain, no palpitations GI: has nausea, no vomiting, abdominal pain, diarrhea, constipation GU: no dysuria, burning on urination, increased urinary frequency, hematuria  Ext: no leg edema Neuro: no unilateral weakness,  numbness, or tingling, no vision change or hearing loss Skin: no rash, no skin tear. MSK: No muscle spasm, no deformity, no limitation of range of movement in spin Heme: No easy bruising.  Travel history: No recent long distant travel.  Allergy:  Allergies  Allergen Reactions  . Valacyclovir Other (See Comments)  . Levaquin [Levofloxacin In D5w] Nausea And Vomiting  . Sulfa Antibiotics Nausea And Vomiting    Past Medical History:  Diagnosis Date  . Chronic atrial fibrillation (HCC)   . CKD (chronic kidney disease), stage III   . Diabetes mellitus without complication (HCC)   . DJD (degenerative joint disease)   . Glaucoma   . Gout   . Hypertension   . Irregular heart beat   . Rheumatoid arthritis (HCC)   . Wheelchair bound     Past Surgical History:  Procedure Laterality Date  . ABDOMINAL HYSTERECTOMY    . APPENDECTOMY    . carpel tunnel    . CATARACT EXTRACTION      Social History:  reports that she has been smoking.  She has never used smokeless tobacco. She reports that she does not drink alcohol or use drugs.  Family History: Reviewed with patient and family, they don't know patient family medical history.   Prior to Admission medications   Medication Sig Start Date End Date Taking? Authorizing Provider  acetaminophen (TYLENOL) 325 MG tablet Take 650 mg by mouth every 6 (six) hours as needed for mild pain.   Yes Historical Provider, MD  allopurinol (ZYLOPRIM) 100 MG tablet Take 100 mg by mouth daily.   Yes Historical Provider, MD  amLODipine (  NORVASC) 10 MG tablet Take 10 mg by mouth daily.   Yes Historical Provider, MD  ASPERCREME W/LIDOCAINE EX Apply 1 each topically every morning.   Yes Historical Provider, MD  bisacodyl (DULCOLAX) 10 MG suppository Place 10 mg rectally every other day.    Yes Historical Provider, MD  cholecalciferol (VITAMIN D) 1000 units tablet Take 1,000 Units by mouth daily.   Yes Historical Provider, MD  colchicine 0.6 MG tablet Take 0.6  mg by mouth daily as needed (gout flare ups).    Yes Historical Provider, MD  gabapentin (NEURONTIN) 100 MG capsule Take 1 capsule by mouth daily as needed. pain 05/30/15  Yes Historical Provider, MD  magnesium hydroxide (MILK OF MAGNESIA) 400 MG/5ML suspension Take 30 mLs by mouth every other day.   Yes Historical Provider, MD  metoprolol (LOPRESSOR) 50 MG tablet Take 1 tablet by mouth 2 (two) times daily.  04/24/15  Yes Historical Provider, MD  mupirocin cream (BACTROBAN) 2 % Apply 1 application topically 2 (two) times daily. 06/21/15  Yes Arby Barrette, MD  OVER THE COUNTER MEDICATION Apply 1 each topically daily.   Yes Historical Provider, MD  OVER THE COUNTER MEDICATION    Yes Historical Provider, MD  Polyethyl Glycol-Propyl Glycol (SYSTANE OP) Place 1 drop into both eyes at bedtime as needed (dry eyes).   Yes Historical Provider, MD  potassium chloride (K-DUR,KLOR-CON) 10 MEQ tablet Take 10 mEq by mouth daily.    Yes Historical Provider, MD  docusate sodium (COLACE) 100 MG capsule Take 1 capsule (100 mg total) by mouth every 12 (twelve) hours. Take daily to avoid constipation with narcotics use. Patient not taking: Reported on 05/01/2016 06/21/15   Arby Barrette, MD  oxyCODONE-acetaminophen (PERCOCET/ROXICET) 5-325 MG tablet Take 1-2 tablets by mouth every 4 (four) hours as needed for severe pain. Patient not taking: Reported on 06/08/2016 06/21/15   Arby Barrette, MD    Physical Exam: Vitals:   06/08/16 2012 06/08/16 2157 06/08/16 2300  BP: 144/63 126/62 123/58  Pulse: 72 62 61  Resp: 20 16 26   Temp: 98.5 F (36.9 C)    TempSrc: Oral    SpO2: 96% 95% 95%   General: Not in acute distress HEENT:       Eyes: PERRL, EOMI, no scleral icterus.       ENT: No discharge from the ears and nose, no pharynx injection, no tonsillar enlargement.        Neck: No JVD, no bruit, no mass felt. Heme: No neck lymph node enlargement. Cardiac: S1/S2, RRR, No murmurs, No gallops or  rubs. Respiratory: No rales, wheezing, rhonchi or rubs. GI: Soft, nondistended, nontender, no rebound pain, no organomegaly, BS present. GU: No hematuria Ext: No pitting leg edema bilaterally. 2+DP/PT pulse bilaterally. Musculoskeletal: No joint deformities, No joint redness or warmth, no limitation of ROM in spin. Skin: No rashes.  Neuro: Alert, oriented X3, cranial nerves II-XII grossly intact, moves all extremities Psych: Patient is not psychotic, no suicidal or hemocidal ideation.  Labs on Admission: I have personally reviewed following labs and imaging studies  CBC:  Recent Labs Lab 06/08/16 2109  WBC 7.9  NEUTROABS 4.9  HGB 14.2  HCT 42.2  MCV 94.8  PLT 202   Basic Metabolic Panel:  Recent Labs Lab 06/08/16 2109  NA 139  K 3.7  CL 105  CO2 27  GLUCOSE 170*  BUN 26*  CREATININE 1.56*  CALCIUM 11.7*   GFR: CrCl cannot be calculated (Unknown ideal weight.). Liver Function Tests:  No results for input(s): AST, ALT, ALKPHOS, BILITOT, PROT, ALBUMIN in the last 168 hours. No results for input(s): LIPASE, AMYLASE in the last 168 hours. No results for input(s): AMMONIA in the last 168 hours. Coagulation Profile: No results for input(s): INR, PROTIME in the last 168 hours. Cardiac Enzymes: No results for input(s): CKTOTAL, CKMB, CKMBINDEX, TROPONINI in the last 168 hours. BNP (last 3 results) No results for input(s): PROBNP in the last 8760 hours. HbA1C: No results for input(s): HGBA1C in the last 72 hours. CBG: No results for input(s): GLUCAP in the last 168 hours. Lipid Profile: No results for input(s): CHOL, HDL, LDLCALC, TRIG, CHOLHDL, LDLDIRECT in the last 72 hours. Thyroid Function Tests: No results for input(s): TSH, T4TOTAL, FREET4, T3FREE, THYROIDAB in the last 72 hours. Anemia Panel: No results for input(s): VITAMINB12, FOLATE, FERRITIN, TIBC, IRON, RETICCTPCT in the last 72 hours. Urine analysis:    Component Value Date/Time   COLORURINE YELLOW  05/21/2015 2118   APPEARANCEUR CLOUDY (A) 05/21/2015 2118   LABSPEC 1.012 05/21/2015 2118   PHURINE 7.0 05/21/2015 2118   GLUCOSEU 100 (A) 05/21/2015 2118   HGBUR NEGATIVE 05/21/2015 2118   BILIRUBINUR NEGATIVE 05/21/2015 2118   KETONESUR NEGATIVE 05/21/2015 2118   PROTEINUR 30 (A) 05/21/2015 2118   UROBILINOGEN 0.2 05/21/2015 2118   NITRITE NEGATIVE 05/21/2015 2118   LEUKOCYTESUR TRACE (A) 05/21/2015 2118   Sepsis Labs: @LABRCNTIP (procalcitonin:4,lacticidven:4) )No results found for this or any previous visit (from the past 240 hour(s)).   Radiological Exams on Admission: No results found.   EKG: Independently reviewed.  Atrial fibrillation, EDC 446, heart rate 63, LAD, nonspecific T-wave change, poor R-wave progression  Assessment/Plan Principal Problem:   Accidental overdose Active Problems:   Gout   DJD (degenerative joint disease)   Chronic atrial fibrillation (HCC)   Diabetes mellitus without complication (HCC)   Essential hypertension   CKD (chronic kidney disease), stage III   Accidental overdose: currently patient is hemodynamically stable. Mental status is at baseline. Blood pressure normal. Slightly bradycardia, oxygen saturation normal on room air. Potassium 3.7, Tylenol level less than 10, salicylate level less than 7. EDP consulted poison control, who recommended observation overnight and repeat EKG every 6 hours  -will place on tele bed for obs -hold all home oral meds -monitoring bp and HR closely -Frequent neuro check -IV fluid: Normal saline at 100 mL per hour -repeat EKG q6h -if develops symptomatic bradycardia or hypotension, may consider to give glucagon to counteract beta blockers effects  Gout: stable -hold continue home allopurinol and Colchicine  HTN: -Hold amlodipine, metoprolol  DJD (degenerative joint disease): -Hold home oxycodone  Atrial Fibrillation: CHA2DS2-VASc Score is 5, needs oral anticoagulation, but patient is not on AC due  to hight risk of fall. Heart rate is well controlled. -Hold metoprolol -tele monitoring.  Diet controlled borderline diabetes mellitus without complication: No A1c on recored. Blood sugar 170. -check CBG every morning.  CKD-III: stable. Baseline creatinine 1.3-1.63. Her creatinine is 1.56, BUN 26. -Follow-up renal function by BMP  DVT ppx: SQ Lovenox Code Status: Full code Family Communication:  Yes, patient's  daughter and granddaughter at bed side Disposition Plan:  Anticipate discharge back to previous home environment Consults called:  none Admission status: Obs / tele   Date of Service 06/09/2016    13/12/2015 [facility] Pager [phone]  If 7PM-7AM, please contact night-coverage [email or web address] Password [email or web address] 06/09/2016, 12:54 AM

## 2016-06-08 NOTE — ED Triage Notes (Signed)
Family reports that pt took too many of her medications this evening; pt took 150mg  Metoprolol; amlodopine 10 mg and then took the rest of her normal daily medications that she had already taken this morning; pt states that she forgot she took them; family reports that her medications are Potassium , allopurinol 100mg  and Colorys 0.6mg  (these are the medications she took twice); pt c/o nausea but denies any other sx at present

## 2016-06-09 ENCOUNTER — Encounter (HOSPITAL_COMMUNITY): Payer: Self-pay | Admitting: Internal Medicine

## 2016-06-09 DIAGNOSIS — E1122 Type 2 diabetes mellitus with diabetic chronic kidney disease: Secondary | ICD-10-CM | POA: Diagnosis not present

## 2016-06-09 DIAGNOSIS — M109 Gout, unspecified: Secondary | ICD-10-CM | POA: Diagnosis present

## 2016-06-09 DIAGNOSIS — Z993 Dependence on wheelchair: Secondary | ICD-10-CM | POA: Insufficient documentation

## 2016-06-09 DIAGNOSIS — T50901A Poisoning by unspecified drugs, medicaments and biological substances, accidental (unintentional), initial encounter: Principal | ICD-10-CM

## 2016-06-09 DIAGNOSIS — M1990 Unspecified osteoarthritis, unspecified site: Secondary | ICD-10-CM | POA: Diagnosis present

## 2016-06-09 DIAGNOSIS — E119 Type 2 diabetes mellitus without complications: Secondary | ICD-10-CM

## 2016-06-09 DIAGNOSIS — I482 Chronic atrial fibrillation, unspecified: Secondary | ICD-10-CM | POA: Diagnosis present

## 2016-06-09 DIAGNOSIS — I10 Essential (primary) hypertension: Secondary | ICD-10-CM | POA: Diagnosis present

## 2016-06-09 DIAGNOSIS — N183 Chronic kidney disease, stage 3 unspecified: Secondary | ICD-10-CM | POA: Diagnosis present

## 2016-06-09 DIAGNOSIS — I129 Hypertensive chronic kidney disease with stage 1 through stage 4 chronic kidney disease, or unspecified chronic kidney disease: Secondary | ICD-10-CM | POA: Diagnosis not present

## 2016-06-09 LAB — BASIC METABOLIC PANEL
ANION GAP: 7 (ref 5–15)
BUN: 22 mg/dL — ABNORMAL HIGH (ref 6–20)
CALCIUM: 11.1 mg/dL — AB (ref 8.9–10.3)
CO2: 26 mmol/L (ref 22–32)
Chloride: 107 mmol/L (ref 101–111)
Creatinine, Ser: 1.36 mg/dL — ABNORMAL HIGH (ref 0.44–1.00)
GFR, EST AFRICAN AMERICAN: 40 mL/min — AB (ref >60)
GFR, EST NON AFRICAN AMERICAN: 34 mL/min — AB (ref >60)
Glucose, Bld: 151 mg/dL — ABNORMAL HIGH (ref 65–99)
Potassium: 3.9 mmol/L (ref 3.5–5.1)
Sodium: 140 mmol/L (ref 135–145)

## 2016-06-09 LAB — CBC
HEMATOCRIT: 40.2 % (ref 36.0–46.0)
Hemoglobin: 12.9 g/dL (ref 12.0–15.0)
MCH: 31.1 pg (ref 26.0–34.0)
MCHC: 32.1 g/dL (ref 30.0–36.0)
MCV: 96.9 fL (ref 78.0–100.0)
PLATELETS: 226 10*3/uL (ref 150–400)
RBC: 4.15 MIL/uL (ref 3.87–5.11)
RDW: 13.5 % (ref 11.5–15.5)
WBC: 7.5 10*3/uL (ref 4.0–10.5)

## 2016-06-09 LAB — HEPATIC FUNCTION PANEL
ALBUMIN: 3.2 g/dL — AB (ref 3.5–5.0)
ALT: 13 U/L — ABNORMAL LOW (ref 14–54)
AST: 16 U/L (ref 15–41)
Alkaline Phosphatase: 62 U/L (ref 38–126)
Bilirubin, Direct: 0.1 mg/dL (ref 0.1–0.5)
Indirect Bilirubin: 0.5 mg/dL (ref 0.3–0.9)
TOTAL PROTEIN: 5.8 g/dL — AB (ref 6.5–8.1)
Total Bilirubin: 0.6 mg/dL (ref 0.3–1.2)

## 2016-06-09 LAB — GLUCOSE, CAPILLARY: Glucose-Capillary: 142 mg/dL — ABNORMAL HIGH (ref 65–99)

## 2016-06-09 MED ORDER — ONDANSETRON HCL 4 MG/2ML IJ SOLN: 4.0000 mg | Freq: Four times a day (QID) | INTRAMUSCULAR | Status: DC | PRN

## 2016-06-09 MED ORDER — SODIUM CHLORIDE 0.9 % IV SOLN
INTRAVENOUS | Status: DC
  Administered 2016-06-09: 01:00:00 via INTRAVENOUS

## 2016-06-09 MED ORDER — SODIUM CHLORIDE 0.9% FLUSH
3.0000 mL | Freq: Two times a day (BID) | INTRAVENOUS | Status: DC
  Administered 2016-06-09: 3 mL via INTRAVENOUS

## 2016-06-09 MED ORDER — LIDOCAINE 5 % EX OINT
TOPICAL_OINTMENT | Freq: Every day | CUTANEOUS | Status: DC | PRN
  Filled 2016-06-09: qty 35.44

## 2016-06-09 MED ORDER — ENOXAPARIN SODIUM 40 MG/0.4ML ~~LOC~~ SOLN: 40.0000 mg | SUBCUTANEOUS | Status: DC

## 2016-06-09 MED ORDER — LIDOCAINE 4 % EX CREA
TOPICAL_CREAM | Freq: Every morning | CUTANEOUS | Status: DC
  Filled 2016-06-09: qty 5

## 2016-06-09 MED ORDER — POLYETHYL GLYCOL-PROPYL GLYCOL 0.4-0.3 % OP GEL: Freq: Every evening | OPHTHALMIC | Status: DC | PRN

## 2016-06-09 MED ORDER — ONDANSETRON HCL 4 MG PO TABS: 4.0000 mg | ORAL_TABLET | Freq: Four times a day (QID) | ORAL | Status: DC | PRN

## 2016-06-09 MED ORDER — POLYVINYL ALCOHOL 1.4 % OP SOLN
1.0000 [drp] | Freq: Every evening | OPHTHALMIC | Status: DC | PRN
  Filled 2016-06-09: qty 15

## 2016-06-09 MED ORDER — MUPIROCIN CALCIUM 2 % EX CREA
1.0000 "application " | TOPICAL_CREAM | Freq: Two times a day (BID) | CUTANEOUS | Status: DC
  Filled 2016-06-09: qty 15

## 2016-06-09 MED ORDER — NICOTINE 21 MG/24HR TD PT24: 21.0000 mg | MEDICATED_PATCH | Freq: Every day | TRANSDERMAL | Status: DC

## 2016-06-09 NOTE — Discharge Summary (Signed)
Physician Discharge Summary  TULIP MEHARG OEV:[id number] DOB: Oct 11, 1927 DOA: 06/08/2016  PCP: Vivien Presto, MD  Admit date: 06/08/2016 Discharge date: 06/09/2016  Admitted From: Home Disposition:  Home  Recommendations for Outpatient Follow-up:  1. Follow up with PCP in 1-2 weeks 2. Monitor blood pressure at home tonight 3. Take medication off table to help alleviate risk of patient taking double dose of medication again  Home Health: No Equipment/Devices: No- patient has wheelchair at home  Discharge Condition: Stable CODE STATUS: DNR Diet recommendation: Heart Healthy / Carb Modified  Brief/Interim Summary:  Rhonda Dudley is a 82 y.o. female with medical history significant of hypertension, diet controlled borderline diabetes, gout, rheumatoid arthritis, DJD, tobacco abuse, A. fib not on anticoagulants, CKD-III, wheelchair-bound, who presents with accidental overdose of home medications  Per pt's daughter and granddaughter, patient took double home medications by accident. Family believes she was confused and forgot that she had taken her evening dose. She is supposed to take 10 mEq of potassium, amlodipine 10 mg, Lopressor 100 mg, and allopurinol 100 mg. She took double the amount of all of them. This a total of 20 mEq of potassium, 20 mg of amlodipine, 200 mg of Lopressor, and 200 mg of allopurinol. Pt has mild nausea, but no vomiting. Patient denies any complaints. No chest pain, shortness of breath, fever, chills, abdominal pain, vision change or hearing loss. No rashes. Family reports she is mentating at her baseline  ED Course: pt was found to have WBC 7.9, Tylenol level less than 10, salicylate level less than 7, potassium 3.7, stable renal function, temperature normal, heart rate 60s, respirations are 26, oxygenation 95% on room air. Pt is placed on tele bed for obs.   Patient did well overnight.  Family and patient voiced readiness to go home on 06/09/16.  Family asked  about whether patient could take her medications tonight.  Discussed with pharmacy and cleared that patient would be fine to take her medications tonight.  Family stated they will keep ahold of patient's medication to ensure this does not happen again.  Encouraged family to take patient's blood pressure at home if patient blood pressure is not low she can take her regular doses of her medications.    Discharge Diagnoses:  Principal Problem:   Accidental overdose Active Problems:   Gout   DJD (degenerative joint disease)   Chronic atrial fibrillation (HCC)   Diabetes mellitus without complication (HCC)   Essential hypertension   CKD (chronic kidney disease), stage III    Discharge Instructions  Discharge Instructions    Call MD for:  difficulty breathing, headache or visual disturbances    Complete by:  As directed    Call MD for:  extreme fatigue    Complete by:  As directed    Call MD for:  persistant dizziness or light-headedness    Complete by:  As directed    Call MD for:  persistant nausea and vomiting    Complete by:  As directed    Call MD for:  severe uncontrolled pain    Complete by:  As directed    Call MD for:  temperature >100.4    Complete by:  As directed    Diet - low sodium heart healthy    Complete by:  As directed    Diet Carb Modified    Complete by:  As directed    Increase activity slowly    Complete by:  As directed  Medication List    STOP taking these medications   oxyCODONE-acetaminophen 5-325 MG tablet Commonly known as:  PERCOCET/ROXICET     TAKE these medications   acetaminophen 325 MG tablet Commonly known as:  TYLENOL Take 650 mg by mouth every 6 (six) hours as needed for mild pain.   allopurinol 100 MG tablet Commonly known as:  ZYLOPRIM Take 100 mg by mouth daily.   amLODipine 10 MG tablet Commonly known as:  NORVASC Take 10 mg by mouth daily.   ASPERCREME W/LIDOCAINE EX Apply 1 each topically every morning.    bisacodyl 10 MG suppository Commonly known as:  DULCOLAX Place 10 mg rectally every other day.   cholecalciferol 1000 units tablet Commonly known as:  VITAMIN D Take 1,000 Units by mouth daily.   colchicine 0.6 MG tablet Take 0.6 mg by mouth daily as needed (gout flare ups).   docusate sodium 100 MG capsule Commonly known as:  COLACE Take 1 capsule (100 mg total) by mouth every 12 (twelve) hours. Take daily to avoid constipation with narcotics use.   gabapentin 100 MG capsule Commonly known as:  NEURONTIN Take 1 capsule by mouth daily as needed. pain   magnesium hydroxide 400 MG/5ML suspension Commonly known as:  MILK OF MAGNESIA Take 30 mLs by mouth every other day.   metoprolol 50 MG tablet Commonly known as:  LOPRESSOR Take 1 tablet by mouth 2 (two) times daily.   mupirocin cream 2 % Commonly known as:  BACTROBAN Apply 1 application topically 2 (two) times daily.   OVER THE COUNTER MEDICATION Apply 1 each topically daily.   OVER THE COUNTER MEDICATION   potassium chloride 10 MEQ tablet Commonly known as:  K-DUR,KLOR-CON Take 10 mEq by mouth daily.   SYSTANE OP Place 1 drop into both eyes at bedtime as needed (dry eyes).       Allergies  Allergen Reactions  . Valacyclovir Other (See Comments)  . Levaquin [Levofloxacin In D5w] Nausea And Vomiting  . Sulfa Antibiotics Nausea And Vomiting    Consultations:  None   Procedures/Studies:  No results found.     Subjective: Patient initially asleep at initial part of exam.  Patient, when awakened, voices readiness to go home.  Family is anxious for patient to be discharged immediately.  Family asked about patient taking her medications tonight.  Patient denies chest pain, chest pressure, palpitations.  Asking to eat.  Discharge Exam: Vitals:   06/08/16 2300 06/09/16 0447  BP: 123/58 (!) 141/60  Pulse: 61 62  Resp: 26 18  Temp:  97.7 F (36.5 C)   Vitals:   06/08/16 2012 06/08/16 2157 06/08/16  2300 06/09/16 0447  BP: 144/63 126/62 123/58 (!) 141/60  Pulse: 72 62 61 62  Resp: 20 16 26 18   Temp: 98.5 F (36.9 C)   97.7 F (36.5 C)  TempSrc: Oral   Oral  SpO2: 96% 95% 95% 94%    General: Pt is alert, awake, not in acute distress Cardiovascular: IRRR, S1/S2 +, no rubs, no gallops Respiratory: CTA bilaterally, no wheezing, no rhonchi Abdominal: Soft, NT, ND, bowel sounds + Extremities: no edema, no cyanosis    The results of significant diagnostics from this hospitalization (including imaging, microbiology, ancillary and laboratory) are listed below for reference.     Microbiology: No results found for this or any previous visit (from the past 240 hour(s)).   Labs: BNP (last 3 results) No results for input(s): BNP in the last 8760 hours. Basic Metabolic Panel:  Recent Labs Lab 06/08/16 2109 06/09/16 0635  NA 139 140  K 3.7 3.9  CL 105 107  CO2 27 26  GLUCOSE 170* 151*  BUN 26* 22*  CREATININE 1.56* 1.36*  CALCIUM 11.7* 11.1*   Liver Function Tests:  Recent Labs Lab 06/09/16 0635  AST 16  ALT 13*  ALKPHOS 62  BILITOT 0.6  PROT 5.8*  ALBUMIN 3.2*   No results for input(s): LIPASE, AMYLASE in the last 168 hours. No results for input(s): AMMONIA in the last 168 hours. CBC:  Recent Labs Lab 06/08/16 2109 06/09/16 0635  WBC 7.9 7.5  NEUTROABS 4.9  --   HGB 14.2 12.9  HCT 42.2 40.2  MCV 94.8 96.9  PLT 202 226   Cardiac Enzymes: No results for input(s): CKTOTAL, CKMB, CKMBINDEX, TROPONINI in the last 168 hours. BNP: Invalid input(s): POCBNP CBG:  Recent Labs Lab 06/09/16 1224  GLUCAP 142*   D-Dimer No results for input(s): DDIMER in the last 72 hours. Hgb A1c No results for input(s): HGBA1C in the last 72 hours. Lipid Profile No results for input(s): CHOL, HDL, LDLCALC, TRIG, CHOLHDL, LDLDIRECT in the last 72 hours. Thyroid function studies No results for input(s): TSH, T4TOTAL, T3FREE, THYROIDAB in the last 72 hours.  Invalid  input(s): FREET3 Anemia work up No results for input(s): VITAMINB12, FOLATE, FERRITIN, TIBC, IRON, RETICCTPCT in the last 72 hours. Urinalysis    Component Value Date/Time   COLORURINE YELLOW 05/21/2015 2118   APPEARANCEUR CLOUDY (A) 05/21/2015 2118   LABSPEC 1.012 05/21/2015 2118   PHURINE 7.0 05/21/2015 2118   GLUCOSEU 100 (A) 05/21/2015 2118   HGBUR NEGATIVE 05/21/2015 2118   BILIRUBINUR NEGATIVE 05/21/2015 2118   KETONESUR NEGATIVE 05/21/2015 2118   PROTEINUR 30 (A) 05/21/2015 2118   UROBILINOGEN 0.2 05/21/2015 2118   NITRITE NEGATIVE 05/21/2015 2118   LEUKOCYTESUR TRACE (A) 05/21/2015 2118   Sepsis Labs Invalid input(s): PROCALCITONIN,  WBC,  LACTICIDVEN Microbiology No results found for this or any previous visit (from the past 240 hour(s)).   Time coordinating discharge: Less than 30 minutes  SIGNED:   Bennett ScrapeAlex Ukleja, MD  [facility] 06/09/2016, 12:31 PM Pager [phone] If 7PM-7AM, please contact night-coverage [email or web address] Password [email or web address]

## 2016-06-10 LAB — HEMOGLOBIN A1C
HEMOGLOBIN A1C: 6.3 % — AB (ref 4.8–5.6)
MEAN PLASMA GLUCOSE: 134 mg/dL

## 2016-08-02 ENCOUNTER — Ambulatory Visit: Payer: Medicare Other | Admitting: Internal Medicine

## 2017-03-29 IMAGING — DX DG CHEST 2V
2 series · 2 of 2 positions shown · non-contrast
Comparison: Chest CT 02/01/2014

CLINICAL DATA: Chest pain.  Irregular heartbeat.

EXAM:
CHEST  2 VIEW

[chest lat]
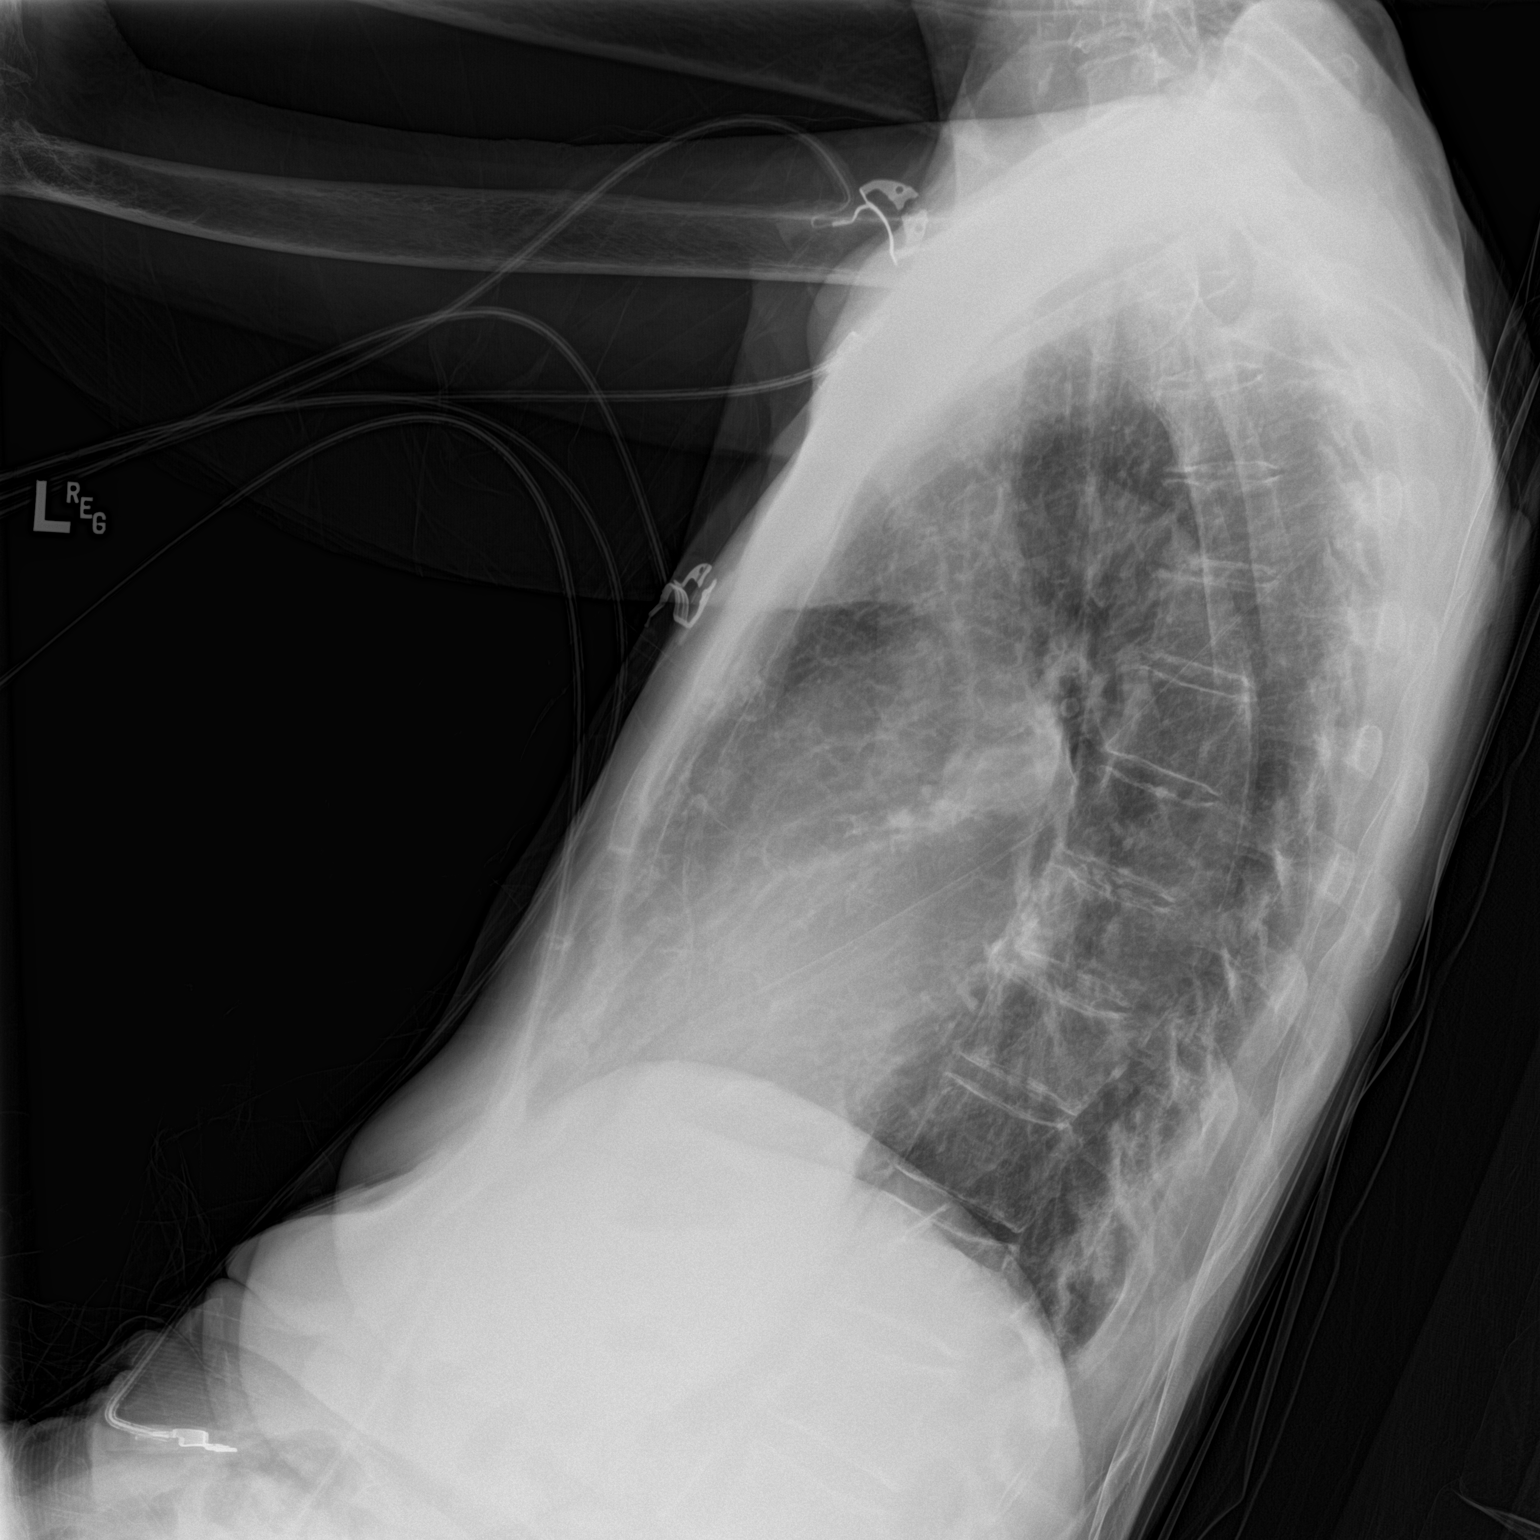

[chest ap]
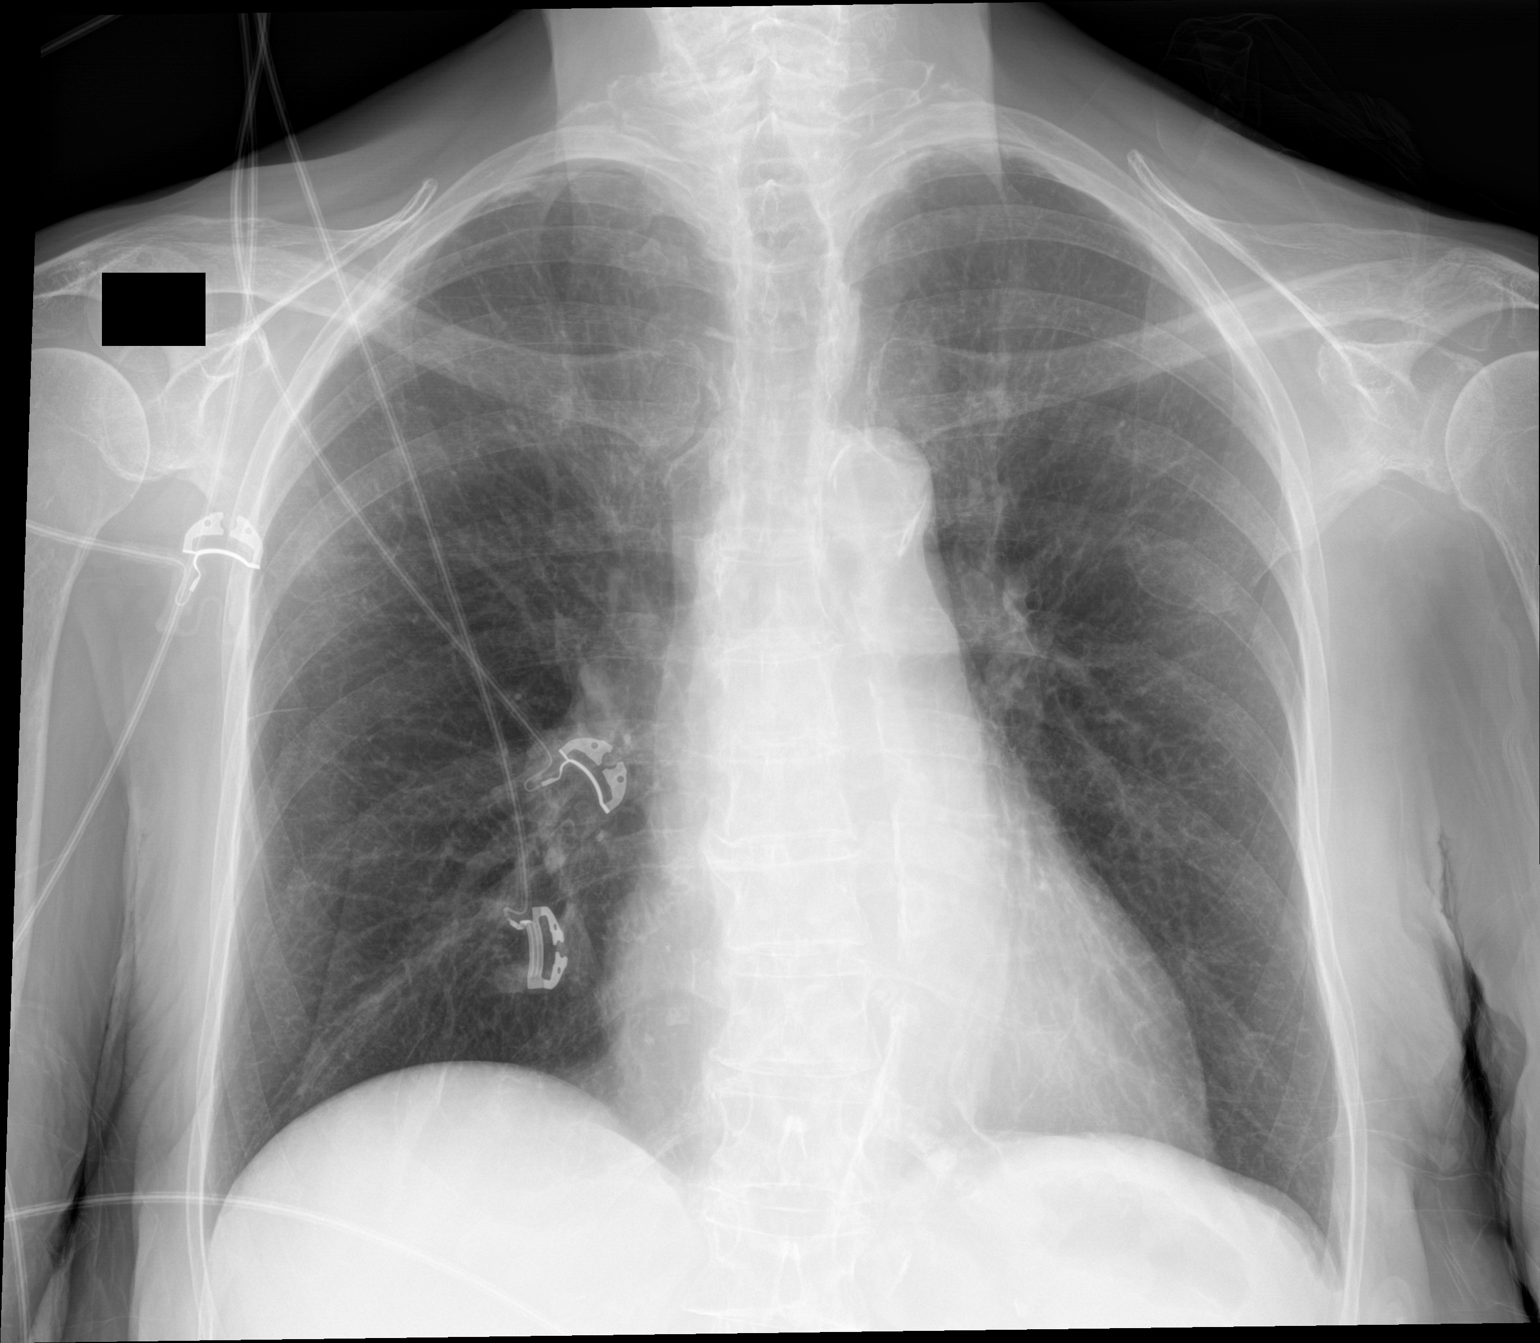

[2 of 2 positions shown; findings below may reference images not displayed]

FINDINGS: Mild cardiomegaly, unchanged from prior exam. There is
atherosclerosis and tortuosity of the thoracic aorta. Minimal
vascular congestion, also unchanged allowing for differences in
modality. No overt pulmonary edema. No confluent airspace disease,
large pleural effusion or pneumothorax. No acute osseous
abnormalities are seen.
IMPRESSION: Mild cardiomegaly and minimal vascular congestion, similar to prior
exam allowing for differences in modality. No acute pulmonary
process.

## 2017-08-05 DEATH — deceased
